# Patient Record
Sex: Female | Born: 1995 | Race: Black or African American | Hispanic: No | Marital: Single | Smoking: Never smoker
Health system: Southern US, Community
[De-identification: ages and names within clinical notes are randomized; demographics above are authoritative.]

## PROBLEM LIST (undated history)

## (undated) ENCOUNTER — Inpatient Hospital Stay (HOSPITAL_COMMUNITY): Payer: Self-pay

## (undated) ENCOUNTER — Emergency Department (HOSPITAL_COMMUNITY): Admission: EM | Discharge status: Absent without leave | Payer: Self-pay

## (undated) DIAGNOSIS — Z789 Other specified health status: Secondary | ICD-10-CM

## (undated) DIAGNOSIS — L309 Dermatitis, unspecified: Secondary | ICD-10-CM

## (undated) HISTORY — PX: NO PAST SURGERIES: SHX2092

---
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
## 2013-04-06 ENCOUNTER — Emergency Department (HOSPITAL_COMMUNITY)
Admission: EM | Admit: 2013-04-06 | Discharge: 2013-04-06 | Disposition: A | Discharge status: Home / Self Care | Payer: Managed Care, Other (non HMO) | Source: Home / Self Care

## 2013-04-06 ENCOUNTER — Encounter (HOSPITAL_COMMUNITY): Payer: Self-pay | Admitting: Emergency Medicine

## 2013-04-06 DIAGNOSIS — J020 Streptococcal pharyngitis: Secondary | ICD-10-CM

## 2013-04-06 MED ORDER — AMOXICILLIN 400 MG/5ML PO SUSR: ORAL | Status: DC

## 2013-04-06 NOTE — ED Provider Notes (Signed)
History     CSN: [id number]  Arrival date & time 04/06/13  1528   None     Chief Complaint  Patient presents with  . Generalized Body Aches    (Consider location/radiation/quality/duration/timing/severity/associated sxs/prior treatment) HPI Comments: 17 year old female comes in with onset of sore throat for 24 hours ago associated with headache, myalgias and low-grade fever. Denies nausea, vomiting or abdominal pain. Denies earache, shortness of breath or chest pain.   History reviewed. No pertinent past medical history.  History reviewed. No pertinent past surgical history.  No family history on file.  History  Substance Use Topics  . Smoking status: Never Smoker   . Smokeless tobacco: Not on file  . Alcohol Use: No    OB History   Grav Para Term Preterm Abortions TAB SAB Ect Mult Living                  Review of Systems  Constitutional: Positive for activity change. Negative for fever.  HENT: Positive for sore throat. Negative for congestion, rhinorrhea, neck pain, neck stiffness, postnasal drip, sinus pressure and ear discharge.   Respiratory: Negative.   Cardiovascular: Negative.   Gastrointestinal: Negative.   Musculoskeletal: Negative.   Skin: Negative.   Hematological: Positive for adenopathy.    Allergies  Review of patient's allergies indicates no known allergies.  Home Medications   Current Outpatient Rx  Name  Route  Sig  Dispense  Refill  . ibuprofen (ADVIL,MOTRIN) 200 MG tablet   Oral   Take 200 mg by mouth every 6 (six) hours as needed for pain.         Marland Kitchen amoxicillin (AMOXIL) 400 MG/5ML suspension      7.5 ml  bid x10 days   150 mL   0     BP 122/85  Pulse 81  Temp(Src) 99.2 F (37.3 C) (Oral)  Resp 20  SpO2 96%  Physical Exam  Nursing note and vitals reviewed. Constitutional: She is oriented to person, place, and time. She appears well-developed and well-nourished. No distress.  HENT:  Head: Normocephalic.  Right Ear:  External ear normal.  Left Ear: External ear normal.  Oropharynx with erythema, mildly enlarged tonsils with gray exudates.  Eyes: Conjunctivae and EOM are normal.  Neck: Normal range of motion. Neck supple.  Positive for bilateral enlarged tender anterior cervical nodes.  Cardiovascular: Normal rate and regular rhythm.   Murmur heard. Pulmonary/Chest: Effort normal. No respiratory distress. She has no rales.  Abdominal: Soft. There is no tenderness.  Musculoskeletal: Normal range of motion.  Lymphadenopathy:    She has cervical adenopathy.  Neurological: She is alert and oriented to person, place, and time. No cranial nerve deficit. She exhibits normal muscle tone.  Skin: Skin is warm and dry.  Psychiatric: She has a normal mood and affect.    ED Course  Procedures (including critical care time)  Labs Reviewed - No data to display No results found.   1. Strep pharyngitis       MDM  Amoxicillin 400 mg 5 meals 7.5 meals twice a day for 10 days No school or work tomorrow Drink plenty of fluids stay well hydrated Ibuprofen 400 mg every 6 hours when necessary sore throat discomfort Cepacol lozenges needed for comfort.        Hayden Rasmussen, NP 04/06/13 1622

## 2013-04-06 NOTE — ED Provider Notes (Signed)
Medical screening examination/treatment/procedure(s) were performed by resident physician or non-physician practitioner and as supervising physician I was immediately available for consultation/collaboration.   Barkley Bruns MD.   Linna Hoff, MD 04/06/13 [phone]

## 2013-04-06 NOTE — ED Notes (Signed)
Patient reports fever, body pain, and throat swelling and pain.  Fever has not been taken, running hot and cold.  Mild headache currently

## 2014-06-05 ENCOUNTER — Encounter (HOSPITAL_COMMUNITY): Payer: Self-pay | Admitting: Emergency Medicine

## 2014-06-05 ENCOUNTER — Emergency Department (HOSPITAL_COMMUNITY)
Admission: EM | Admit: 2014-06-05 | Discharge: 2014-06-05 | Disposition: A | Discharge status: Home / Self Care | Payer: Managed Care, Other (non HMO) | Attending: Emergency Medicine | Admitting: Emergency Medicine

## 2014-06-05 DIAGNOSIS — M545 Low back pain, unspecified: Secondary | ICD-10-CM

## 2014-06-05 DIAGNOSIS — Y92410 Unspecified street and highway as the place of occurrence of the external cause: Secondary | ICD-10-CM | POA: Insufficient documentation

## 2014-06-05 DIAGNOSIS — Y9389 Activity, other specified: Secondary | ICD-10-CM | POA: Insufficient documentation

## 2014-06-05 DIAGNOSIS — Z791 Long term (current) use of non-steroidal anti-inflammatories (NSAID): Secondary | ICD-10-CM | POA: Insufficient documentation

## 2014-06-05 DIAGNOSIS — IMO0002 Reserved for concepts with insufficient information to code with codable children: Secondary | ICD-10-CM | POA: Insufficient documentation

## 2014-06-05 MED ORDER — IBUPROFEN 800 MG PO TABS: 800.0000 mg | ORAL_TABLET | Freq: Three times a day (TID) | ORAL | Status: DC | PRN

## 2014-06-05 MED ORDER — CYCLOBENZAPRINE HCL 10 MG PO TABS: 10.0000 mg | ORAL_TABLET | Freq: Three times a day (TID) | ORAL | Status: DC | PRN

## 2014-06-05 NOTE — ED Provider Notes (Signed)
Medical screening examination/treatment/procedure(s) were performed by non-physician practitioner and as supervising physician I was immediately available for consultation/collaboration.   EKG Interpretation None        Glynn OctaveStephen Laguana Desautel, MD 06/05/14 2258
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
## 2014-08-30 ENCOUNTER — Ambulatory Visit: Payer: Managed Care, Other (non HMO)

## 2015-01-02 ENCOUNTER — Emergency Department (HOSPITAL_COMMUNITY)
Admission: EM | Admit: 2015-01-02 | Discharge: 2015-01-02 | Disposition: A | Discharge status: Home / Self Care | Payer: Managed Care, Other (non HMO) | Attending: Emergency Medicine | Admitting: Emergency Medicine

## 2015-01-02 ENCOUNTER — Emergency Department (HOSPITAL_COMMUNITY): Payer: Managed Care, Other (non HMO)

## 2015-01-02 ENCOUNTER — Encounter (HOSPITAL_COMMUNITY): Payer: Self-pay | Admitting: Emergency Medicine

## 2015-01-02 DIAGNOSIS — Z3202 Encounter for pregnancy test, result negative: Secondary | ICD-10-CM | POA: Insufficient documentation

## 2015-01-02 DIAGNOSIS — R10A1 Flank pain, right side: Secondary | ICD-10-CM

## 2015-01-02 DIAGNOSIS — R319 Hematuria, unspecified: Secondary | ICD-10-CM

## 2015-01-02 DIAGNOSIS — Z79899 Other long term (current) drug therapy: Secondary | ICD-10-CM | POA: Insufficient documentation

## 2015-01-02 DIAGNOSIS — R109 Unspecified abdominal pain: Secondary | ICD-10-CM

## 2015-01-02 DIAGNOSIS — N390 Urinary tract infection, site not specified: Secondary | ICD-10-CM

## 2015-01-02 LAB — LIPASE, BLOOD: LIPASE: 28 U/L (ref 11–59)

## 2015-01-02 LAB — COMPREHENSIVE METABOLIC PANEL
ALK PHOS: 57 U/L (ref 39–117)
ALT: 11 U/L (ref 0–35)
AST: 22 U/L (ref 0–37)
Albumin: 3.7 g/dL (ref 3.5–5.2)
Anion gap: 3 — ABNORMAL LOW (ref 5–15)
BUN: 8 mg/dL (ref 6–23)
CHLORIDE: 104 mmol/L (ref 96–112)
CO2: 30 mmol/L (ref 19–32)
CREATININE: 0.58 mg/dL (ref 0.50–1.10)
Calcium: 9.1 mg/dL (ref 8.4–10.5)
GFR calc Af Amer: >90 mL/min (ref >90)
GFR calc non Af Amer: >90 mL/min (ref >90)
Glucose, Bld: 97 mg/dL (ref 70–99)
POTASSIUM: 3.7 mmol/L (ref 3.5–5.1)
Sodium: 137 mmol/L (ref 135–145)
Total Bilirubin: 0.6 mg/dL (ref 0.3–1.2)
Total Protein: 7.4 g/dL (ref 6.0–8.3)

## 2015-01-02 LAB — URINALYSIS, ROUTINE W REFLEX MICROSCOPIC
Bilirubin Urine: NEGATIVE
Glucose, UA: NEGATIVE mg/dL
Ketones, ur: NEGATIVE mg/dL
NITRITE: POSITIVE — AB
Protein, ur: 100 mg/dL — AB
SPECIFIC GRAVITY, URINE: 1.019 (ref 1.005–1.030)
Urobilinogen, UA: 0.2 mg/dL (ref 0.0–1.0)
pH: 5 (ref 5.0–8.0)

## 2015-01-02 LAB — URINE MICROSCOPIC-ADD ON

## 2015-01-02 LAB — CBC WITH DIFFERENTIAL/PLATELET
Basophils Absolute: 0 10*3/uL (ref 0.0–0.1)
Basophils Relative: 1 % (ref 0–1)
Eosinophils Absolute: 0.1 10*3/uL (ref 0.0–0.7)
Eosinophils Relative: 2 % (ref 0–5)
HCT: 37.9 % (ref 36.0–46.0)
Hemoglobin: 12.8 g/dL (ref 12.0–15.0)
Lymphocytes Relative: 45 % (ref 12–46)
Lymphs Abs: 2 10*3/uL (ref 0.7–4.0)
MCH: 28.4 pg (ref 26.0–34.0)
MCHC: 33.8 g/dL (ref 30.0–36.0)
MCV: 84 fL (ref 78.0–100.0)
Monocytes Absolute: 0.4 10*3/uL (ref 0.1–1.0)
Monocytes Relative: 9 % (ref 3–12)
Neutro Abs: 1.9 10*3/uL (ref 1.7–7.7)
Neutrophils Relative %: 43 % (ref 43–77)
Platelets: 379 10*3/uL (ref 150–400)
RBC: 4.51 MIL/uL (ref 3.87–5.11)
RDW: 12.6 % (ref 11.5–15.5)
WBC: 4.5 10*3/uL (ref 4.0–10.5)

## 2015-01-02 LAB — PREGNANCY, URINE: PREG TEST UR: NEGATIVE

## 2015-01-02 MED ORDER — DEXTROSE 5 % IV SOLN
1.0000 g | Freq: Once | INTRAVENOUS | Status: AC
  Administered 2015-01-02: 1 g via INTRAVENOUS
  Filled 2015-01-02: qty 10

## 2015-01-02 MED ORDER — ACETAMINOPHEN-CODEINE 120-12 MG/5ML PO SOLN: 15.0000 mL | ORAL | Status: DC | PRN

## 2015-01-02 MED ORDER — HYDROMORPHONE HCL 1 MG/ML IJ SOLN
1.0000 mg | Freq: Once | INTRAMUSCULAR | Status: AC
  Administered 2015-01-02: 1 mg via INTRAVENOUS
  Filled 2015-01-02: qty 1

## 2015-01-02 MED ORDER — ONDANSETRON HCL 4 MG/2ML IJ SOLN
4.0000 mg | Freq: Once | INTRAMUSCULAR | Status: AC
  Administered 2015-01-02: 4 mg via INTRAVENOUS
  Filled 2015-01-02: qty 2

## 2015-01-02 MED ORDER — SODIUM CHLORIDE 0.9 % IV SOLN
1000.0000 mL | INTRAVENOUS | Status: DC
  Administered 2015-01-02: 1000 mL via INTRAVENOUS

## 2015-01-02 MED ORDER — SODIUM CHLORIDE 0.9 % IV SOLN
1000.0000 mL | Freq: Once | INTRAVENOUS | Status: AC
  Administered 2015-01-02: 1000 mL via INTRAVENOUS

## 2015-01-02 MED ORDER — CEPHALEXIN 250 MG/5ML PO SUSR: 500.0000 mg | Freq: Four times a day (QID) | ORAL | Status: AC

## 2015-01-02 NOTE — ED Provider Notes (Signed)
Patient seen and evaluated. Discussed with Dr. Preston FleetingGlick. CT pending at shift change. I discussed this with her. No signs of obstructive uropathy or hydronephrosis. Does show signs early nephrocalcinosis. I discussed this with patient. Robotic surgery in few. States her pain is well controlled. Plan is discharge home per Dr. Preston FleetingGlick with prescription for Keflex, and Phenergan codeine syrup.  Rolland PorterMark Kayan Blissett, MD 01/02/15 [phone]

## 2015-01-02 NOTE — Discharge Instructions (Signed)
Urinary Tract Infection Urinary tract infections (UTIs) can develop anywhere along your urinary tract. Your urinary tract is your body's drainage system for removing wastes and extra water. Your urinary tract includes two kidneys, two ureters, a bladder, and a urethra. Your kidneys are a pair of bean-shaped organs. Each kidney is about the size of your fist. They are located below your ribs, one on each side of your spine. CAUSES Infections are caused by microbes, which are microscopic organisms, including fungi, viruses, and bacteria. These organisms are so small that they can only be seen through a microscope. Bacteria are the microbes that most commonly cause UTIs. SYMPTOMS  Symptoms of UTIs may vary by age and gender of the patient and by the location of the infection. Symptoms in young women typically include a frequent and intense urge to urinate and a painful, burning feeling in the bladder or urethra during urination. Older women and men are more likely to be tired, shaky, and weak and have muscle aches and abdominal pain. A fever may mean the infection is in your kidneys. Other symptoms of a kidney infection include pain in your back or sides below the ribs, nausea, and vomiting. DIAGNOSIS To diagnose a UTI, your caregiver will ask you about your symptoms. Your caregiver also will ask to provide a urine sample. The urine sample will be tested for bacteria and white blood cells. White blood cells are made by your body to help fight infection. TREATMENT  Typically, UTIs can be treated with medication. Because most UTIs are caused by a bacterial infection, they usually can be treated with the use of antibiotics. The choice of antibiotic and length of treatment depend on your symptoms and the type of bacteria causing your infection. HOME CARE INSTRUCTIONS  If you were prescribed antibiotics, take them exactly as your caregiver instructs you. Finish the medication even if you feel better after you  have only taken some of the medication.  Drink enough water and fluids to keep your urine clear or pale yellow.  Avoid caffeine, tea, and carbonated beverages. They tend to irritate your bladder.  Empty your bladder often. Avoid holding urine for long periods of time.  Empty your bladder before and after sexual intercourse.  After a bowel movement, women should cleanse from front to back. Use each tissue only once. SEEK MEDICAL CARE IF:   You have back pain.  You develop a fever.  Your symptoms do not begin to resolve within 3 days. SEEK IMMEDIATE MEDICAL CARE IF:   You have severe back pain or lower abdominal pain.  You develop chills.  You have nausea or vomiting.  You have continued burning or discomfort with urination. MAKE SURE YOU:   Understand these instructions.  Will watch your condition.  Will get help right away if you are not doing well or get worse. Document Released: 08/22/2005 Document Revised: 05/13/2012 Document Reviewed: 12/21/2011 [facility] Patient Information 2015 [place], [place]. This information is not intended to replace advice given to you by your health care provider. Make sure you discuss any questions you have with your health care provider.  Cephalexin oral suspension What is this medicine? CEPHALEXIN (sef a LEX in) is a cephalosporin antibiotic. It is used to treat certain kinds of bacterial infections.It will not work for colds, flu, or other viral infections. This medicine may be used for other purposes; ask your health care provider or pharmacist if you have questions. COMMON BRAND NAME(S): Biocef, Keflex, Panixine What should I tell my  health care provider before I take this medicine? They need to know if you have any of these conditions: -kidney disease -stomach or intestine problems, especially colitis -an unusual or allergic reaction to cephalexin, other cephalosporins, penicillins, other antibiotics, medicines, foods, dyes or  preservatives -pregnant or trying to get pregnant -breast-feeding How should I use this medicine? Take this medicine by mouth. Follow the directions on your prescription label. Shake well before using. Use a specially marked spoon or container to measure your medicine. Ask your pharmacist if you do not have one. Household spoons are not accurate. You can take this medicine with food or on an empty stomach. If the medicine upsets your stomach, take it with food. Do not take your medicine more often than directed. Finish the full course prescribed by your doctor or health care professional even if you think your condition is better. Talk to your pediatrician regarding the use of this medicine in children. While this drug may be prescribed for selected conditions, precautions do apply. Overdosage: If you think you have taken too much of this medicine contact a poison control center or emergency room at once. NOTE: This medicine is only for you. Do not share this medicine with others. What if I miss a dose? If you miss a dose, take it as soon as you can. If it is almost time for your next dose, take only that dose. Do not take double or extra doses. There should be at least 4 to 6 hours between doses. What may interact with this medicine? -probenecid -some other antibiotics This list may not describe all possible interactions. Give your health care provider a list of all the medicines, herbs, non-prescription drugs, or dietary supplements you use. Also tell them if you smoke, drink alcohol, or use illegal drugs. Some items may interact with your medicine. What should I watch for while using this medicine? Tell your doctor or health care professional if your symptoms do not begin to improve in a few days. Do not treat diarrhea with over the counter products. Contact your doctor if you have diarrhea that lasts more than 2 days or if it is severe and watery. If you have diabetes, you may get a  false-positive result for sugar in your urine. Check with your doctor or health care professional. What side effects may I notice from receiving this medicine? Side effects that you should report to your doctor or health care professional as soon as possible: -allergic reactions like skin rash, itching or hives, swelling of the face, lips, or tongue -breathing problems -pain or difficulty passing urine -redness, blistering, peeling or loosening of the skin, including inside the mouth -severe or watery diarrhea -unusually weak or tired -yellowing of the eyes, skin Side effects that usually do not require medical attention (report to your doctor or health care professional if they continue or are bothersome): -gas or heartburn -genital or anal irritation -headache -joint or muscle pain -nausea, vomiting This list may not describe all possible side effects. Call your doctor for medical advice about side effects. You may report side effects to FDA at 1-800-FDA-1088. Where should I keep my medicine? Keep out of the reach of children. After this medicine is mixed by your pharmacist, store it in the refrigerator. Do not freeze. Throw away any unused medicine after 14 days. NOTE: This sheet is a summary. It may not cover all possible information. If you have questions about this medicine, talk to your doctor, pharmacist, or health care provider.  2015, Elsevier/Gold Standard. (2008-02-16 17:10:55)  Acetaminophen; Codeine oral solution What is this medicine? ACETAMINOPHEN; CODEINE (a set a MEE noe fen; KOE deen) is a pain reliever. It is used to treat mild to moderate pain. This medicine may be used for other purposes; ask your health care provider or pharmacist if you have questions. COMMON BRAND NAME(S): TY-PAP with Codeine, Tylenol with Codeine What should I tell my health care provider before I take this medicine? They need to know if you have any of these conditions: -brain tumor -Crohn's  disease, inflammatory bowel disease, or ulcerative colitis -drug abuse or addiction -head injury -heart or circulation problems -if you often drink alcohol -kidney disease or problems going to the bathroom -liver disease -lung disease, asthma, or breathing problems -an unusual or allergic reaction to acetaminophen, codeine, parabens, other medicines, foods, dyes, or preservatives -pregnant or trying to get pregnant -breast-feeding How should I use this medicine? Take this medicine by mouth. Use a specially marked spoon or dropper to measure your dose. Ask your pharmacist if you do not have a dropper or measuring spoon. Do not use a household spoon. Follow the directions on the prescription label. If the medicine upsets your stomach, take the medicine with food or milk. Do not take more than you are told to take. Talk to your pediatrician regarding the use of this medicine in children. Special care may be needed. Overdosage: If you think you have taken too much of this medicine contact a poison control center or emergency room at once. NOTE: This medicine is only for you. Do not share this medicine with others. What if I miss a dose? If you miss a dose, take it as soon as you can. If it is almost time for your next dose, take only that dose. Do not take double or extra doses. What may interact with this medicine? -alcohol -antihistamines -carbamazepine -isoniazid -medicines for depression, anxiety, or psychotic disturbances -medicines for sleep -muscle relaxants -naltrexone -narcotic medicines (opiates) for pain -phenobarbital, phenytoin, and fosphenytoin -tramadol This list may not describe all possible interactions. Give your health care provider a list of all the medicines, herbs, non-prescription drugs, or dietary supplements you use. Also tell them if you smoke, drink alcohol, or use illegal drugs. Some items may interact with your medicine. What should I watch for while using this  medicine? Tell your doctor or health care professional if your pain does not go away, if it gets worse, or if you have new or a different type of pain. You may develop tolerance to the medicine. Tolerance means that you will need a higher dose of the medicine for pain relief. Tolerance is normal and is expected if you take the medicine for a long time. Do not suddenly stop taking your medicine because you may develop a severe reaction. Your body becomes used to the medicine. This does NOT mean you are addicted. Addiction is a behavior related to getting and using a drug for a non-medical reason. If you have pain, you have a medical reason to take pain medicine. Your doctor will tell you how much medicine to take. If your doctor wants you to stop the medicine, the dose will be slowly lowered over time to avoid any side effects. You may get drowsy or dizzy when you first start taking the medicine or change doses. Do not drive, use machinery, or do anything that may be dangerous until you know how the medicine affects you. Stand or sit up slowly. There  are different types of narcotic medicines (opiates) for pain. If you take more than one type at the same time, you may have more side effects. Give your health care provider a list of all medicines you use. Your doctor will tell you how much medicine to take. Do not take more medicine than directed. Call emergency for help if you have problems breathing. The medicine will cause constipation. Try to have a bowel movement at least every 2 to 3 days. If you do not have a bowel movement for 3 days, call your doctor or health care professional. Too much acetaminophen can be very dangerous. Do not take Tylenol (acetaminophen) or medicines that contain acetaminophen with this medicine. Many non-prescription medicines contain acetaminophen. Always read the labels carefully. Immediately call your physician or get emergency help if you are breast-feeding and your baby is  sleepier than usual, is limp, or has difficulty breastfeeding or breathing. What side effects may I notice from receiving this medicine? Side effects that you should report to your doctor or health care professional as soon as possible: -allergic reactions like skin rash, itching or hives, swelling of the face, lips, or tongue -breathing problems -confusion -feeling faint or lightheaded, falls -stomach pain -unusual bleeding or bruising -unusually weak or tired -yellowing of the eyes, skin Side effects that usually do not require medical attention (report to your doctor or health care professional if they continue or are bothersome): -nausea, vomiting This list may not describe all possible side effects. Call your doctor for medical advice about side effects. You may report side effects to FDA at 1-800-FDA-1088. Where should I keep my medicine? Keep out of the reach of children. This medicine can be abused. Keep your medicine in a safe place to protect it from theft. Do not share this medicine with anyone. Selling or giving away this medicine is dangerous and against the law. Store at room temperature between 15 and 30 degrees C (59 and 86 degrees F). Protect from light. Keep container tightly closed. Throw away any unused medicine after the expiration date. Discard unused medicine and used packaging carefully. Pets and children can be harmed if they find used or lost packages. NOTE: This sheet is a summary. It may not cover all possible information. If you have questions about this medicine, talk to your doctor, pharmacist, or health care provider.  2015, Elsevier/Gold Standard. (2013-07-06 13:07:50)
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
## 2015-01-02 NOTE — ED Notes (Signed)
Pt reports R flank pain for the past 24 hours. Denies injury, dysuria, nvd, fevers, chills.

## 2015-01-02 NOTE — ED Notes (Signed)
Pt reports right flank pain since yesterday with no urinary symptoms.  Pt describes pain as sharp and appears to have pain while ambulating.  Pt denies any n/v/d.  Pt alert and oriented.

## 2015-01-04 LAB — URINE CULTURE: Colony Count: >=100000

## 2015-01-05 ENCOUNTER — Telehealth (HOSPITAL_BASED_OUTPATIENT_CLINIC_OR_DEPARTMENT_OTHER): Payer: Self-pay | Admitting: Emergency Medicine

## 2015-01-05 NOTE — Telephone Encounter (Signed)
Post ED Visit - Positive Culture Follow-up  Culture report reviewed by antimicrobial stewardship pharmacist: []  Wes Dulaney, Pharm.D., BCPS [x]  Celedonio MiyamotoJeremy Frens, Pharm.D., BCPS []  Georgina PillionElizabeth Martin, Pharm.D., BCPS []  ToluMinh Pham, [address]Ph[address] BCPS, AAHIVP []  Estella HuskMichelle Turner, Pharm.D., BCPS, AAHIVP []  Elder CyphersLorie Poole, [address]Ph[address]D., BCPS  Positive urine culture E. Coli Treated with cephalexin, organism sensitive to the same and no further patient follow-up is required at this time.  Jaclyn MullMiller, Jaclyn Delgado 01/05/2015, 4:58 PM

## 2015-04-28 ENCOUNTER — Emergency Department (HOSPITAL_COMMUNITY)
Admission: EM | Admit: 2015-04-28 | Discharge: 2015-04-28 | Disposition: A | Discharge status: Home / Self Care | Payer: 59 | Attending: Emergency Medicine | Admitting: Emergency Medicine

## 2015-04-28 ENCOUNTER — Encounter (HOSPITAL_COMMUNITY): Payer: Self-pay

## 2015-04-28 DIAGNOSIS — M25531 Pain in right wrist: Secondary | ICD-10-CM | POA: Insufficient documentation

## 2015-04-28 MED ORDER — IBUPROFEN 800 MG PO TABS: 800.0000 mg | ORAL_TABLET | Freq: Three times a day (TID) | ORAL | Status: DC | PRN

## 2015-04-28 NOTE — ED Notes (Signed)
Pt states she woke up 4 days ago with right wrist pain and is not sure what caused it. Denies injury. No deformity noted.

## 2015-04-28 NOTE — Discharge Instructions (Signed)
Wear a wrist splint to help decrease wrist pain.  Take ibuprofen as needed for pain.  Avoid repetitive movement.  Arthralgia Your caregiver has diagnosed you as suffering from an arthralgia. Arthralgia means there is pain in a joint. This can come from many reasons including:  Bruising the joint which causes soreness (inflammation) in the joint.  Wear and tear on the joints which occur as we grow older (osteoarthritis).  Overusing the joint.  Various forms of arthritis.  Infections of the joint. Regardless of the cause of pain in your joint, most of these different pains respond to anti-inflammatory drugs and rest. The exception to this is when a joint is infected, and these cases are treated with antibiotics, if it is a bacterial infection. HOME CARE INSTRUCTIONS   Rest the injured area for as long as directed by your caregiver. Then slowly start using the joint as directed by your caregiver and as the pain allows. Crutches as directed may be useful if the ankles, knees or hips are involved. If the knee was splinted or casted, continue use and care as directed. If an stretchy or elastic wrapping bandage has been applied today, it should be removed and re-applied every 3 to 4 hours. It should not be applied tightly, but firmly enough to keep swelling down. Watch toes and feet for swelling, bluish discoloration, coldness, numbness or excessive pain. If any of these problems (symptoms) occur, remove the ace bandage and re-apply more loosely. If these symptoms persist, contact your caregiver or return to this location.  For the first 24 hours, keep the injured extremity elevated on pillows while lying down.  Apply ice for 15-20 minutes to the sore joint every couple hours while awake for the first half day. Then 03-04 times per day for the first 48 hours. Put the ice in a plastic bag and place a towel between the bag of ice and your skin.  Wear any splinting, casting, elastic bandage  applications, or slings as instructed.  Only take over-the-counter or prescription medicines for pain, discomfort, or fever as directed by your caregiver. Do not use aspirin immediately after the injury unless instructed by your physician. Aspirin can cause increased bleeding and bruising of the tissues.  If you were given crutches, continue to use them as instructed and do not resume weight bearing on the sore joint until instructed. Persistent pain and inability to use the sore joint as directed for more than 2 to 3 days are warning signs indicating that you should see a caregiver for a follow-up visit as soon as possible. Initially, a hairline fracture (break in bone) may not be evident on X-rays. Persistent pain and swelling indicate that further evaluation, non-weight bearing or use of the joint (use of crutches or slings as instructed), or further X-rays are indicated. X-rays may sometimes not show a small fracture until a week or 10 days later. Make a follow-up appointment with your own caregiver or one to whom we have referred you. A radiologist (specialist in reading X-rays) may read your X-rays. Make sure you know how you are to obtain your X-ray results. Do not assume everything is normal if you do not hear from [place]. SEEK MEDICAL CARE IF: Bruising, swelling, or pain increases. SEEK IMMEDIATE MEDICAL CARE IF:   Your fingers or toes are numb or blue.  The pain is not responding to medications and continues to stay the same or get worse.  The pain in your joint becomes severe.  You develop  a fever over 102 F (38.9 C).  It becomes impossible to move or use the joint. MAKE SURE YOU:   Understand these instructions.  Will watch your condition.  Will get help right away if you are not doing well or get worse. Document Released: 11/12/2005 Document Revised: 02/04/2012 Document Reviewed: 06/30/2008 [facility] Patient Information 2015 [place], [place]. This information is not intended to  replace advice given to you by your health care provider. Make sure you discuss any questions you have with your health care provider.  Carpal Tunnel Syndrome Carpal tunnel syndrome is a disorder of the nervous system in the wrist that causes pain, hand weakness, and/or loss of feeling. Carpal tunnel syndrome is caused by the compression, stretching, or irritation of the median nerve at the wrist joint. Athletes who experience carpal tunnel syndrome may notice a decrease in their performance to the condition, especially for sports that require strong hand or wrist action.  SYMPTOMS   Tingling, numbness, or burning pain in the hand or fingers.  Inability to sleep due to pain in the hand.  Sharp pains that shoot from the wrist up the arm or to the fingers, especially at night.  Morning stiffness or cramping of the hand.  Thumb weakness, resulting in difficulty holding objects or making a fist.  Shiny, dry skin on the hand.  Reduced performance in any sport requiring a strong grip. CAUSES   Median nerve damage at the wrist is caused by pressure due to swelling, inflammation, or scarred tissue.  Sources of pressure include:  Repetitive gripping or squeezing that causes inflammation of the tendon sheaths.  Scarring or shortening of the ligament that covers the median nerve.  Traumatic injury to the wrist or forearm such as fracture, sprain, or dislocation.  Prolonged hyperextension (wrist bent backward) or hyperflexion (wrist bent downward) of the wrist. RISK INCREASES WITH:  Diabetes mellitus.  Menopause or amenorrhea.  Rheumatoid arthritis.  Raynaud disease.  Pregnancy.  Gout.  Kidney disease.  Ganglion cyst.  Repetitive hand or wrist action.  Hypothyroidism (underactive thyroid gland).  Repetitive jolting or shaking of the hands or wrist.  Prolonged forceful weight-bearing on the hands. PREVENTION  Bracing the hand and wrist straight during activities that  involve repetitive grasping.  For activities that require prolonged extension of the wrist (bending towards the top of the forearm) periodically change the position of your wrists.  Learn and use proper technique in activities that result in the wrist position in neutral to slight extension.  Avoid bending the wrist into full extension or flexion (up or down).  Keep the wrist in a straight (neutral) position. To keep the wrist in this position, wear a splint.  Avoid repetitive hand and wrist motions.  When possible avoid prolonged grasping of items (steering wheel of a car, a pen, a vacuum cleaner, or a rake).  Loosen your grip for activities that require prolonged grasping of items.  Place keyboards and writing surfaces at the correct height as to decrease strain on the wrist and hand.  Alternate work tasks to avoid prolonged wrist flexion.  Avoid pinching activities (needlework and writing) as they may irritate your carpal tunnel syndrome.  If these activities are necessary, complete them for shorter periods of time.  When writing, use a felt tip or rollerball pen and/or build up the grip on a pen to decrease the forces required for writing. PROGNOSIS  Carpal tunnel syndrome is usually curable with appropriate conservative treatment and sometimes resolves spontaneously. For some  cases, surgery is necessary, especially if muscle wasting or nerve changes have developed.  RELATED COMPLICATIONS   Permanent numbness and a weak thumb or fingers in the affected hand.  Permanent paralysis of a portion of the hand and fingers. TREATMENT  Treatment initially consists of stopping activities that aggravate the symptoms as well as medication and ice to reduce inflammation. A wrist splint is often recommended for wear during activities of repetitive motion as well as at night. It is also important to learn and use proper technique when performing activities that typically cause pain. On  occasion, a corticosteroid injection may be given. If symptoms persist despite conservative treatment, surgery may be an option. Surgical techniques free the pinched or compressed nerve. Carpal tunnel surgery is usually performed on an outpatient basis, meaning you go home the same day as surgery. These procedures provide almost complete relief of all symptoms in 95% of patients. Expect at least 2 weeks for healing after surgery. For cases that are the result of repeated jolting or shaking of the hand or wrist or prolonged hyperextension, surgery is not usually recommended because stretching of the median nerve, not compression, is usually the cause of carpal tunnel syndrome in these cases. MEDICATION   If pain medication is necessary, nonsteroidal anti-inflammatory medications, such as aspirin and ibuprofen, or other minor pain relievers, such as acetaminophen, are often recommended.  Do not take pain medication for 7 days before surgery.  Prescription pain relievers are usually only prescribed after surgery. Use only as directed and only as much as you need.  Corticosteroid injections may be given to reduce inflammation. However, they are not always recommended.  Vitamin B6 (pyridoxine) may reduce symptoms; use only if prescribed for your disorder. SEEK MEDICAL CARE IF:   Symptoms get worse or do not improve in 2 weeks despite treatment.  You also have a current or recent history of neck or shoulder injury that has resulted in pain or tingling elsewhere in your arm. Document Released: 11/12/2005 Document Revised: 03/29/2014 Document Reviewed: 02/24/2009 [facility] Patient Information 2015 [place], [place]. This information is not intended to replace advice given to you by your health care provider. Make sure you discuss any questions you have with your health care provider.

## 2015-04-28 NOTE — ED Notes (Signed)
[place], PA at bedside 

## 2015-04-28 NOTE — ED Provider Notes (Signed)
CSN: [id number]     Arrival date & time 04/28/15  1611 History   This chart was scribed for non-physician practitioner Jaclyn Helper, PA-C working with Arby Barrette, MD by Lyndel Safe, ED Scribe. This patient was seen in room TR04C/TR04C and the patient's care was started at 4:58 PM.    Chief Complaint  Patient presents with  . Wrist Pain   HPI  HPI Comments: Jaclyn Delgado is a 19 y.o. female who presents to the Emergency Department complaining of constant, moderate, sharp right wrist pain onset 4 days ago. She notes that she is a Conservation officer, nature at [employer] and her work requires a lot of competitive movements. The pt is right handed. She has not tried any alleviating treatments. She denies numbness, tingling, radiation of the pain, loss of sensation, trouble in her elbow, CP, SOB, or dyspnea.   History reviewed. No pertinent past medical history. History reviewed. No pertinent past surgical history. Family History  Problem Relation Age of Onset  . Renal Disease Mother    History  Substance Use Topics  . Smoking status: Never Smoker   . Smokeless tobacco: Not on file  . Alcohol Use: No   OB History    No data available     Review of Systems  Constitutional: Negative for fever.  Respiratory: Negative for shortness of breath.   Cardiovascular: Negative for chest pain.  Musculoskeletal: Positive for arthralgias.  Neurological: Negative for weakness and numbness.      Allergies  Review of patient's allergies indicates no known allergies.  Home Medications   Prior to Admission medications   Medication Sig Start Date End Date Taking? Authorizing Provider  acetaminophen-codeine 120-12 MG/5ML solution Take 15 mLs by mouth every 4 (four) hours as needed for moderate pain. 01/02/15   Dione Booze, MD  cyclobenzaprine (FLEXERIL) 10 MG tablet Take 1 tablet (10 mg total) by mouth 3 (three) times daily as needed for muscle spasms. 06/05/14   Trixie Dredge, PA-C  ibuprofen (ADVIL,MOTRIN) 200 MG  tablet Take 200 mg by mouth every 6 (six) hours as needed for pain.    Historical Provider, MD  ibuprofen (ADVIL,MOTRIN) 800 MG tablet Take 1 tablet (800 mg total) by mouth every 8 (eight) hours as needed for mild pain or moderate pain. 06/05/14   Trixie Dredge, PA-C   BP 112/74 mmHg  Pulse 61  Temp(Src) 98.4 F (36.9 C) (Oral)  Resp 18  Ht  (1.626 m)  Wt 108 lb (48.988 kg)  BMI 18.53 kg/m2  SpO2 100%  LMP 04/12/2015 Physical Exam  Constitutional: She appears well-developed and well-nourished.  HENT:  Head: Normocephalic and atraumatic.  Eyes: Conjunctivae are normal. Right eye exhibits no discharge. Left eye exhibits no discharge.  Pulmonary/Chest: Effort normal. No respiratory distress.  Musculoskeletal:  Mild tenderness to dorsal right wrist with no gross deformity and no rash.  Radial pulse 2+.  Normal grip and strength.     Neurological: She is alert. Coordination normal.  Skin: Skin is warm and dry. No rash noted. She is not diaphoretic. No erythema.  Psychiatric: She has a normal mood and affect.  Nursing note and vitals reviewed.   ED Course  Procedures  DIAGNOSTIC STUDIES: Oxygen Saturation is 100% on RA, normal  by my interpretation.    COORDINATION OF CARE: 4:53 PM Discussed treatment plan which includes to order NSAIDs with pt. Discussed with pt that her wrist is not likely to be broken so an X-ray is not necessary. Advised pt to  soak wrist in warm water with Epson salt. Discussed that repetative movements can result in arthritis. This could also be early carpal tunnel syndrome.  Pt acknowledges and agrees to plan.   Labs Review Labs Reviewed - No data to display  Imaging Review No results found.   EKG Interpretation None      MDM   Final diagnoses:  Wrist joint pain, right    BP 112/74 mmHg  Pulse 61  Temp(Src) 98.4 F (36.9 C) (Oral)  Resp 18  Ht 5\' 4"  (1.626 m)  Wt 108 lb (48.988 kg)  BMI 18.53 kg/m2  SpO2 100%  LMP 04/12/2015   I  personally performed the services described in this documentation, which was scribed in my presence. The recorded information has been reviewed and is accurate.     Jaclyn HelperBowie Esten Dollar, PA-C 04/28/15 1709  Arby BarretteMarcy Pfeiffer, MD 04/28/15 2233

## 2015-07-22 ENCOUNTER — Emergency Department (INDEPENDENT_AMBULATORY_CARE_PROVIDER_SITE_OTHER)
Admission: EM | Admit: 2015-07-22 | Discharge: 2015-07-22 | Disposition: A | Discharge status: Home / Self Care | Payer: 59 | Source: Home / Self Care | Attending: Family Medicine | Admitting: Family Medicine

## 2015-07-22 ENCOUNTER — Encounter (HOSPITAL_COMMUNITY): Payer: Self-pay | Admitting: *Deleted

## 2015-07-22 DIAGNOSIS — S31831A Laceration without foreign body of anus, initial encounter: Secondary | ICD-10-CM

## 2015-07-22 MED ORDER — HYDROCORTISONE ACETATE 25 MG RE SUPP: 25.0000 mg | Freq: Two times a day (BID) | RECTAL | Status: DC

## 2015-07-22 NOTE — ED Provider Notes (Signed)
CSN: [id number]     Arrival date & time 07/22/15  1637 History   First MD Initiated Contact with Patient 07/22/15 1751     Chief Complaint  Patient presents with  . Rectal Bleeding   (Consider location/radiation/quality/duration/timing/severity/associated sxs/prior Treatment) Patient is a 19 y.o. female presenting with hematochezia. The history is provided by the patient.  Rectal Bleeding Quality:  Bright red Amount:  Scant Duration:  1 day (on tues had 1 episode, none since.) Timing:  Rare Progression:  Resolved Chronicity:  New Context: not rectal pain   Context comment:  Pt clueless about issue., amybe constipation.   History reviewed. No pertinent past medical history. History reviewed. No pertinent past surgical history. Family History  Problem Relation Age of Onset  . Renal Disease Mother    Social History  Substance Use Topics  . Smoking status: Never Smoker   . Smokeless tobacco: None  . Alcohol Use: No   OB History    No data available     Review of Systems  Constitutional: Negative.   Gastrointestinal: Positive for constipation, hematochezia and anal bleeding. Negative for diarrhea, blood in stool and rectal pain.  Genitourinary: Negative.     Allergies  Review of patient's allergies indicates no known allergies.  Home Medications   Prior to Admission medications   Medication Sig Start Date End Date Taking? Authorizing Provider  acetaminophen-codeine 120-12 MG/5ML solution Take 15 mLs by mouth every 4 (four) hours as needed for moderate pain. 01/02/15   Dione Booze, MD  cyclobenzaprine (FLEXERIL) 10 MG tablet Take 1 tablet (10 mg total) by mouth 3 (three) times daily as needed for muscle spasms. 06/05/14   Trixie Dredge, PA-C  hydrocortisone (ANUSOL-HC) 25 MG suppository Place 1 suppository (25 mg total) rectally 2 (two) times daily. 07/22/15   Linna Hoff, MD  ibuprofen (ADVIL,MOTRIN) 800 MG tablet Take 1 tablet (800 mg total) by mouth every 8 (eight) hours  as needed for mild pain or moderate pain. 04/28/15   Fayrene Helper, PA-C   Meds Ordered and Administered this Visit  Medications - No data to display  BP 128/80 mmHg  Pulse 78  Temp(Src) 98.6 F (37 C) (Oral)  Resp 16  SpO2 100%  LMP 06/26/2015 No data found.   Physical Exam  Constitutional: She is oriented to person, place, and time. She appears well-developed and well-nourished.  Abdominal: Soft.  Since bleeding has resolved and no further issues, no digital exam today,   Neurological: She is alert and oriented to person, place, and time.  Skin: Skin is warm and dry.  Nursing note and vitals reviewed.   ED Course  Procedures (including critical care time)  Labs Review Labs Reviewed - No data to display  Imaging Review No results found.   Visual Acuity Review  Right Eye Distance:   Left Eye Distance:   Bilateral Distance:    Right Eye Near:   Left Eye Near:    Bilateral Near:         MDM   1. Tear of anal skin, initial encounter        Linna Hoff, MD 07/22/15 [phone]

## 2015-07-22 NOTE — ED Notes (Signed)
Pt  Reports      Symptoms  Of  Rectal  Bleeding     She  Noticed   When  She  Had a  bm      A  Few  Days  Ago    -  She  Also  Reports  Some  Pain in the  l  Side  Of  Her  Back       She  Reports  Some  Episodes  Of  Constipation  Which  She  Reports  Has  Had      intermittantly in the  Past   -  She  denys  Any urinary  Symptoms     She  Is  Sitting upright on the  Exam table  Speaking in  Complete  sentances

## 2015-09-17 ENCOUNTER — Encounter (HOSPITAL_COMMUNITY): Payer: Self-pay | Admitting: *Deleted

## 2015-09-17 ENCOUNTER — Emergency Department (INDEPENDENT_AMBULATORY_CARE_PROVIDER_SITE_OTHER)
Admission: EM | Admit: 2015-09-17 | Discharge: 2015-09-17 | Disposition: A | Discharge status: Home / Self Care | Payer: 59 | Source: Home / Self Care | Attending: Family Medicine | Admitting: Family Medicine

## 2015-09-17 DIAGNOSIS — J069 Acute upper respiratory infection, unspecified: Secondary | ICD-10-CM

## 2015-09-17 MED ORDER — IPRATROPIUM BROMIDE 0.06 % NA SOLN: 2.0000 | Freq: Four times a day (QID) | NASAL | Status: DC

## 2015-09-17 NOTE — ED Notes (Signed)
Pt  Reports      Cough      Congested        Scratchy  Throat          -   Pt    Reports     Symptoms  X  3  Days  Not  releived  By otc  meds                Sitting   Upright on the  Exam table  Speaking    In  Complete    sentances

## 2015-09-17 NOTE — Discharge Instructions (Signed)
Drink plenty of fluids as discussed, use medicine as prescribed, and mucinex or delsym for cough. Return or see your doctor if further problems °

## 2015-09-17 NOTE — ED Provider Notes (Signed)
CSN: [id number]     Arrival date & time 09/17/15  1847 History   First MD Initiated Contact with Patient 09/17/15 1909     Chief Complaint  Patient presents with  . URI   (Consider location/radiation/quality/duration/timing/severity/associated sxs/prior Treatment) Patient is a 19 y.o. female presenting with URI. The history is provided by the patient.  URI Presenting symptoms: congestion, cough, rhinorrhea and sore throat   Presenting symptoms: no fever   Severity:  Mild Onset quality:  Gradual Duration:  3 days Progression:  Unchanged Chronicity:  New Relieved by:  Nothing Ineffective treatments:  OTC medications Associated symptoms: no myalgias, no sneezing and no wheezing   Risk factors: sick contacts     History reviewed. No pertinent past medical history. History reviewed. No pertinent past surgical history. Family History  Problem Relation Age of Onset  . Renal Disease Mother    Social History  Substance Use Topics  . Smoking status: Never Smoker   . Smokeless tobacco: None  . Alcohol Use: No   OB History    No data available     Review of Systems  Constitutional: Negative.  Negative for fever.  HENT: Positive for congestion, postnasal drip, rhinorrhea and sore throat. Negative for sneezing.   Respiratory: Positive for cough. Negative for wheezing.   Cardiovascular: Negative.   Musculoskeletal: Negative for myalgias.  All other systems reviewed and are negative.   Allergies  Review of patient's allergies indicates no known allergies.  Home Medications   Prior to Admission medications   Medication Sig Start Date End Date Taking? Authorizing Provider  acetaminophen-codeine 120-12 MG/5ML solution Take 15 mLs by mouth every 4 (four) hours as needed for moderate pain. 01/02/15   Dione Boozeavid Glick, MD  cyclobenzaprine (FLEXERIL) 10 MG tablet Take 1 tablet (10 mg total) by mouth 3 (three) times daily as needed for muscle spasms. 06/05/14   Trixie DredgeEmily West, PA-C   hydrocortisone (ANUSOL-HC) 25 MG suppository Place 1 suppository (25 mg total) rectally 2 (two) times daily. 07/22/15   Linna HoffJames D Tomeika Weinmann, MD  ibuprofen (ADVIL,MOTRIN) 800 MG tablet Take 1 tablet (800 mg total) by mouth every 8 (eight) hours as needed for mild pain or moderate pain. 04/28/15   Fayrene HelperBowie Tran, PA-C  ipratropium (ATROVENT) 0.06 % nasal spray Place 2 sprays into both nostrils 4 (four) times daily. 09/17/15   Linna HoffJames D Daiquan Resnik, MD   Meds Ordered and Administered this Visit  Medications - No data to display  LMP 08/23/2015 No data found.   Physical Exam  Constitutional: She is oriented to person, place, and time. She appears well-developed and well-nourished. No distress.  HENT:  Mouth/Throat: Uvula is midline and mucous membranes are normal. Posterior oropharyngeal erythema present. No oropharyngeal exudate.  Neck: Normal range of motion. Neck supple.  Cardiovascular: Normal heart sounds and intact distal pulses.   Pulmonary/Chest: Effort normal and breath sounds normal.  Lymphadenopathy:    She has no cervical adenopathy.  Neurological: She is alert and oriented to person, place, and time.  Skin: Skin is warm and dry.  Nursing note and vitals reviewed.   ED Course  Procedures (including critical care time)  Labs Review Labs Reviewed - No data to display  Imaging Review No results found.   Visual Acuity Review  Right Eye Distance:   Left Eye Distance:   Bilateral Distance:    Right Eye Near:   Left Eye Near:    Bilateral Near:         MDM  1. URI (upper respiratory infection)        Linna Hoff, MD 09/19/15 1308

## 2015-10-19 ENCOUNTER — Emergency Department (INDEPENDENT_AMBULATORY_CARE_PROVIDER_SITE_OTHER)
Admission: EM | Admit: 2015-10-19 | Discharge: 2015-10-19 | Disposition: A | Discharge status: Home / Self Care | Payer: 59 | Source: Home / Self Care | Attending: Emergency Medicine | Admitting: Emergency Medicine

## 2015-10-19 ENCOUNTER — Encounter (HOSPITAL_COMMUNITY): Payer: Self-pay | Admitting: Emergency Medicine

## 2015-10-19 DIAGNOSIS — L309 Dermatitis, unspecified: Secondary | ICD-10-CM | POA: Diagnosis not present

## 2015-10-19 MED ORDER — TRIAMCINOLONE ACETONIDE 0.1 % EX CREA: 1.0000 "application " | TOPICAL_CREAM | Freq: Two times a day (BID) | CUTANEOUS | Status: DC

## 2015-10-19 NOTE — Discharge Instructions (Signed)
You have eczema. This will likely flare up more in the winter months. Make sure you are applying a thick layer of lotion every day. You can use Eucerin, Aveeno, cocoa butter. Use the triamcinolone cream twice a day as needed for flareups. Follow-up as needed.

## 2015-10-19 NOTE — ED Notes (Signed)
Eczema flare up

## 2015-10-19 NOTE — ED Provider Notes (Signed)
CSN: [id number]     Arrival date & time 10/19/15  1308 History   First MD Initiated Contact with Patient 10/19/15 1327     Chief Complaint  Patient presents with  . Eczema   (Consider location/radiation/quality/duration/timing/severity/associated sxs/prior Treatment) HPI  She is a 19 year old woman here for eczema. She states she has a history of eczema and it started to flareup a few days ago. She reports a patch on her left upper arm and on her right buttocks. It is itchy. She states she normally uses triamcinolone cream, but that her prescription ran out.  She is not currently using any lotion or emollients.  History reviewed. No pertinent past medical history. History reviewed. No pertinent past surgical history. Family History  Problem Relation Age of Onset  . Renal Disease Mother    Social History  Substance Use Topics  . Smoking status: Never Smoker   . Smokeless tobacco: None  . Alcohol Use: No   OB History    No data available     Review of Systems As in history of present illness Allergies  Review of patient's allergies indicates no known allergies.  Home Medications   Prior to Admission medications   Medication Sig Start Date End Date Taking? Authorizing Provider  acetaminophen-codeine 120-12 MG/5ML solution Take 15 mLs by mouth every 4 (four) hours as needed for moderate pain. 01/02/15   Dione Boozeavid Glick, MD  cyclobenzaprine (FLEXERIL) 10 MG tablet Take 1 tablet (10 mg total) by mouth 3 (three) times daily as needed for muscle spasms. 06/05/14   Trixie DredgeEmily West, PA-C  hydrocortisone (ANUSOL-HC) 25 MG suppository Place 1 suppository (25 mg total) rectally 2 (two) times daily. 07/22/15   Linna HoffJames D Kindl, MD  ibuprofen (ADVIL,MOTRIN) 800 MG tablet Take 1 tablet (800 mg total) by mouth every 8 (eight) hours as needed for mild pain or moderate pain. 04/28/15   Fayrene HelperBowie Tran, PA-C  ipratropium (ATROVENT) 0.06 % nasal spray Place 2 sprays into both nostrils 4 (four) times daily. 09/17/15    Linna HoffJames D Kindl, MD  triamcinolone cream (KENALOG) 0.1 % Apply 1 application topically 2 (two) times daily. 10/19/15   Charm RingsErin J Maurisio Ruddy, MD   Meds Ordered and Administered this Visit  Medications - No data to display  BP 121/77 mmHg  Pulse 55  Temp(Src) 97.5 F (36.4 C) (Oral)  Resp 16  SpO2 100%  LMP 10/15/2015 No data found.   Physical Exam  Constitutional: She is oriented to person, place, and time. She appears well-developed and well-nourished. No distress.  Cardiovascular: Normal rate.   Pulmonary/Chest: Effort normal.  Neurological: She is alert and oriented to person, place, and time.  Skin:  Eczematous rash to left upper inner arm. There are some excoriations.    ED Course  Procedures (including critical care time)  Labs Review Labs Reviewed - No data to display  Imaging Review No results found.   MDM   1. Eczema    Prescription given for triamcinolone cream. Recommended frequent use of lotion or emollients to prevent flareups.    Charm RingsErin J Mariel Gaudin, MD 10/19/15 1346

## 2016-04-08 ENCOUNTER — Encounter (HOSPITAL_COMMUNITY): Payer: Self-pay | Admitting: *Deleted

## 2016-04-08 ENCOUNTER — Emergency Department (HOSPITAL_COMMUNITY)
Admission: EM | Admit: 2016-04-08 | Discharge: 2016-04-08 | Disposition: A | Discharge status: Home / Self Care | Payer: 59 | Attending: Emergency Medicine | Admitting: Emergency Medicine

## 2016-04-08 DIAGNOSIS — R079 Chest pain, unspecified: Secondary | ICD-10-CM | POA: Diagnosis present

## 2016-04-08 DIAGNOSIS — R0789 Other chest pain: Secondary | ICD-10-CM | POA: Diagnosis not present

## 2016-04-08 NOTE — ED Notes (Signed)
Patient left at this time with all belongings. 

## 2016-04-08 NOTE — Discharge Instructions (Signed)

## 2016-04-08 NOTE — ED Notes (Signed)
There pt has been drinking wine and she has had chest paion for 10 minutes  lmp this month

## 2016-04-08 NOTE — ED Provider Notes (Signed)
CSN: [id number]     Arrival date & time 04/08/16  0138 History   First MD Initiated Contact with Patient 04/08/16 [phone]     Chief Complaint  Patient presents with  . Chest Pain     (Consider location/radiation/quality/duration/timing/severity/associated sxs/prior Treatment) Patient is a 20 y.o. female presenting with chest pain. The history is provided by the patient.  Chest Pain Pain location:  R chest (right shoulder) Pain quality: aching   Pain radiates to:  Does not radiate Pain severity:  Mild Onset quality:  Gradual Duration:  1 hour Timing:  Sporadic Progression:  Resolved Chronicity:  New Context comment:  Drinking wine Relieved by:  Nothing Worsened by:  Nothing tried Ineffective treatments:  None tried Associated symptoms: no abdominal pain, no cough, no fever, no lower extremity edema, no shortness of breath, no syncope and not vomiting     History reviewed. No pertinent past medical history. History reviewed. No pertinent past surgical history. No family history on file. Social History  Substance Use Topics  . Smoking status: Never Smoker   . Smokeless tobacco: None  . Alcohol Use: Yes   OB History    No data available     Review of Systems  Constitutional: Negative for fever.  Respiratory: Negative for cough and shortness of breath.   Cardiovascular: Positive for chest pain. Negative for syncope.  Gastrointestinal: Negative for vomiting and abdominal pain.  All other systems reviewed and are negative.     Allergies  Review of patient's allergies indicates no known allergies.  Home Medications   Prior to Admission medications   Not on File   BP 120/90 mmHg  Pulse 64  Temp(Src) 98 F (36.7 C)  Resp 16  Ht 5\' 4"  (1.626 m)  Wt 108 lb (48.988 kg)  BMI 18.53 kg/m2  SpO2 97%  LMP 04/01/2016 Physical Exam  Constitutional: She is oriented to person, place, and time. She appears well-developed and well-nourished. No distress.  HENT:  Head:  Normocephalic.  Eyes: Conjunctivae are normal.  Neck: Neck supple. No tracheal deviation present.  Cardiovascular: Normal rate, regular rhythm and normal heart sounds.   Pulmonary/Chest: Effort normal and breath sounds normal. No respiratory distress. She has no wheezes. She exhibits no tenderness.  Abdominal: Soft. She exhibits no distension. There is no tenderness.  Musculoskeletal:       Right shoulder: She exhibits normal range of motion, no tenderness, no swelling, no crepitus, no pain and normal strength.  Neurological: She is alert and oriented to person, place, and time.  Skin: Skin is warm and dry.  Psychiatric: She has a normal mood and affect.    ED Course  Procedures (including critical care time) Labs Review Labs Reviewed - No data to display  Imaging Review No results found. I have personally reviewed and evaluated these images and lab results as part of my medical decision-making.   EKG Interpretation   Date/Time:  Sunday Apr 08 2016 01:40:59 EDT Ventricular Rate:  57 PR Interval:  130 QRS Duration: 92 QT Interval:  404 QTC Calculation: 393 R Axis:   90 Text Interpretation:  Sinus bradycardia with sinus arrhythmia Rightward  axis Otherwise normal ECG No previous tracing Confirmed by Augustin Bun MD,  Joden Bonsall ([id number]) on 04/08/2016 4:59:31 AM      MDM   Final diagnoses:  Atypical chest pain   20 y.o. female presents with right shoulder pain and tightness in the center of her chest after drinking wine. Patient is sleeping comfortably when I  examined her and this has resolved. EKG interpreted by me without ST or T wave changes concerning for myocardial ischemia. No delta wave, no prolonged QTc, no brugada to suggest arrhythmogenicity.  Suspect noncardiac etiology currently. Plan to follow up with PCP as needed and return precautions discussed for worsening or new concerning symptoms.     Lyndal Pulley, MD 04/08/16 1000

## 2016-05-17 ENCOUNTER — Ambulatory Visit (INDEPENDENT_AMBULATORY_CARE_PROVIDER_SITE_OTHER): Payer: 59

## 2016-05-17 ENCOUNTER — Encounter: Payer: Self-pay | Admitting: Obstetrics and Gynecology

## 2016-05-17 DIAGNOSIS — Z3201 Encounter for pregnancy test, result positive: Secondary | ICD-10-CM | POA: Diagnosis not present

## 2016-05-17 LAB — POCT PREGNANCY, URINE: Preg Test, Ur: POSITIVE — AB

## 2016-05-17 NOTE — Progress Notes (Signed)
Pt presented today for a pregnancy test. Test confirms she is pregnant approximately six weeks five days. Pt plans to start care at our office. Pt advised to start taking prenatal vitamins.

## 2016-06-14 ENCOUNTER — Inpatient Hospital Stay (HOSPITAL_COMMUNITY)
Admission: AD | Admit: 2016-06-14 | Discharge: 2016-06-15 | Disposition: A | Discharge status: Home / Self Care | Payer: 59 | Source: Ambulatory Visit | Attending: Family Medicine | Admitting: Family Medicine

## 2016-06-14 ENCOUNTER — Inpatient Hospital Stay (HOSPITAL_COMMUNITY): Payer: 59

## 2016-06-14 DIAGNOSIS — O3481 Maternal care for other abnormalities of pelvic organs, first trimester: Secondary | ICD-10-CM | POA: Diagnosis not present

## 2016-06-14 DIAGNOSIS — R102 Pelvic and perineal pain: Secondary | ICD-10-CM | POA: Diagnosis present

## 2016-06-14 DIAGNOSIS — N949 Unspecified condition associated with female genital organs and menstrual cycle: Secondary | ICD-10-CM | POA: Diagnosis not present

## 2016-06-14 DIAGNOSIS — O9989 Other specified diseases and conditions complicating pregnancy, childbirth and the puerperium: Secondary | ICD-10-CM | POA: Diagnosis not present

## 2016-06-14 DIAGNOSIS — Z3A11 11 weeks gestation of pregnancy: Secondary | ICD-10-CM | POA: Insufficient documentation

## 2016-06-14 DIAGNOSIS — O26891 Other specified pregnancy related conditions, first trimester: Secondary | ICD-10-CM

## 2016-06-14 DIAGNOSIS — O208 Other hemorrhage in early pregnancy: Secondary | ICD-10-CM | POA: Insufficient documentation

## 2016-06-14 DIAGNOSIS — O26892 Other specified pregnancy related conditions, second trimester: Secondary | ICD-10-CM | POA: Diagnosis not present

## 2016-06-14 DIAGNOSIS — N8312 Corpus luteum cyst of left ovary: Secondary | ICD-10-CM | POA: Diagnosis not present

## 2016-06-14 LAB — CBC
HCT: 33.5 % — ABNORMAL LOW (ref 36.0–46.0)
Hemoglobin: 11.8 g/dL — ABNORMAL LOW (ref 12.0–15.0)
MCH: 28.9 pg (ref 26.0–34.0)
MCHC: 35.2 g/dL (ref 30.0–36.0)
MCV: 82.1 fL (ref 78.0–100.0)
PLATELETS: 283 10*3/uL (ref 150–400)
RBC: 4.08 MIL/uL (ref 3.87–5.11)
RDW: 12.9 % (ref 11.5–15.5)
WBC: 6 10*3/uL (ref 4.0–10.5)

## 2016-06-14 LAB — POCT PREGNANCY, URINE: PREG TEST UR: POSITIVE — AB

## 2016-06-14 LAB — URINALYSIS, ROUTINE W REFLEX MICROSCOPIC
BILIRUBIN URINE: NEGATIVE
Glucose, UA: NEGATIVE mg/dL
HGB URINE DIPSTICK: NEGATIVE
KETONES UR: NEGATIVE mg/dL
Leukocytes, UA: NEGATIVE
Nitrite: NEGATIVE
PROTEIN: NEGATIVE mg/dL
Specific Gravity, Urine: 1.025 (ref 1.005–1.030)
pH: 6 (ref 5.0–8.0)

## 2016-06-14 LAB — HCG, QUANTITATIVE, PREGNANCY: hCG, Beta Chain, Quant, S: 92192 m[IU]/mL — ABNORMAL HIGH (ref <5)

## 2016-06-14 LAB — OB RESULTS CONSOLE ABO/RH: RH TYPE: POSITIVE

## 2016-06-14 LAB — OB RESULTS CONSOLE RUBELLA ANTIBODY, IGM: RUBELLA: IMMUNE

## 2016-06-14 LAB — OB RESULTS CONSOLE ANTIBODY SCREEN: ANTIBODY SCREEN: NEGATIVE

## 2016-06-14 LAB — OB RESULTS CONSOLE HEPATITIS B SURFACE ANTIGEN: HEP B S AG: NEGATIVE

## 2016-06-14 NOTE — MAU Note (Signed)
PT  SAYS  SHE WENT  TO CLINIC  4  WEEKS  AGO -  HAD POSITIVE UPT-   BUT  NOTHING  IN COMPUTER.       STARTED   CRAMPING  AT 330PM.-   TOOK XS TYLENOL   2  TABS     AT 8PM-    NO RELIEF.    NO VAG  BLEEDING.  LAST SEX-    LAST WEEK .

## 2016-06-15 ENCOUNTER — Encounter (HOSPITAL_COMMUNITY): Payer: Self-pay | Admitting: *Deleted

## 2016-06-15 DIAGNOSIS — O9989 Other specified diseases and conditions complicating pregnancy, childbirth and the puerperium: Secondary | ICD-10-CM

## 2016-06-15 DIAGNOSIS — N949 Unspecified condition associated with female genital organs and menstrual cycle: Secondary | ICD-10-CM | POA: Diagnosis not present

## 2016-06-15 LAB — WET PREP, GENITAL
CLUE CELLS WET PREP: NONE SEEN
Sperm: NONE SEEN
Trich, Wet Prep: NONE SEEN
YEAST WET PREP: NONE SEEN

## 2016-06-15 LAB — GC/CHLAMYDIA PROBE AMP (~~LOC~~) NOT AT ARMC
Chlamydia: POSITIVE — AB
Neisseria Gonorrhea: NEGATIVE

## 2016-06-15 LAB — ABO/RH: ABO/RH(D): O POS

## 2016-06-15 NOTE — MAU Provider Note (Signed)
History     CSN: [id number]  Arrival date and time: 06/14/16 2027   None     Chief Complaint  Patient presents with  . Pelvic Pain   Pelvic Pain The patient's primary symptoms include pelvic pain. This is a new problem. The current episode started today. The problem occurs intermittently. The problem has been unchanged. Pain severity now: 8/10  The problem affects both sides. She is pregnant. There has been no bleeding. Nothing aggravates the symptoms. She has tried nothing for the symptoms. Menstrual history: LMP 03/31/16    No past medical history on file.  No past surgical history on file.  No family history on file.  Social History  Substance Use Topics  . Smoking status: Never Smoker   . Smokeless tobacco: Not on file  . Alcohol Use: Yes    Allergies: No Known Allergies  No prescriptions prior to admission    Review of Systems  Genitourinary: Positive for pelvic pain.   Physical Exam   Blood pressure 111/71, pulse 81, temperature 98.1 F (36.7 C), temperature source Oral, resp. rate 18, height 5\' 4"  (1.626 m), weight 111 lb (50.349 kg), last menstrual period 03/31/2016.  Physical Exam  Nursing note and vitals reviewed. Constitutional: She is oriented to person, place, and time. She appears well-developed and well-nourished. No distress.  HENT:  Head: Normocephalic.  Cardiovascular: Normal rate.   Respiratory: Effort normal.  GI: Soft. There is no tenderness. There is no rebound.  Neurological: She is alert and oriented to person, place, and time.  Skin: Skin is warm and dry.  Psychiatric: She has a normal mood and affect.   Results for orders placed or performed during the hospital encounter of 06/14/16 (from the past 24 hour(s))  Urinalysis, Routine w reflex microscopic ([facility])     Status: None   Collection Time: 06/14/16  9:27 PM  Result Value Ref Range   Color, Urine YELLOW YELLOW   APPearance CLEAR CLEAR   Specific Gravity, Urine 1.025 1.005 -  1.030   pH 6.0 5.0 - 8.0   Glucose, UA NEGATIVE NEGATIVE mg/dL   Hgb urine dipstick NEGATIVE NEGATIVE   Bilirubin Urine NEGATIVE NEGATIVE   Ketones, ur NEGATIVE NEGATIVE mg/dL   Protein, ur NEGATIVE NEGATIVE mg/dL   Nitrite NEGATIVE NEGATIVE   Leukocytes, UA NEGATIVE NEGATIVE  Pregnancy, urine POC     Status: Abnormal   Collection Time: 06/14/16  9:39 PM  Result Value Ref Range   Preg Test, Ur POSITIVE (A) NEGATIVE  hCG, quantitative, pregnancy     Status: Abnormal   Collection Time: 06/14/16 10:21 PM  Result Value Ref Range   hCG, Beta Chain, Quant, S 92192 (H) <5 mIU/mL  CBC     Status: Abnormal   Collection Time: 06/14/16 10:21 PM  Result Value Ref Range   WBC 6.0 4.0 - 10.5 K/uL   RBC 4.08 3.87 - 5.11 MIL/uL   Hemoglobin 11.8 (L) 12.0 - 15.0 g/dL   HCT 40.933.5 (L) 81.136.0 - 91.446.0 %   MCV 82.1 78.0 - 100.0 fL   MCH 28.9 26.0 - 34.0 pg   MCHC 35.2 30.0 - 36.0 g/dL   RDW 78.212.9 95.611.5 - 21.315.5 %   Platelets 283 150 - 400 K/uL  ABO/Rh     Status: None (Preliminary result)   Collection Time: 06/14/16 10:21 PM  Result Value Ref Range   ABO/RH(D) O POS   Wet prep, genital     Status: Abnormal   Collection  Time: 06/15/16 12:20 AM  Result Value Ref Range   Yeast Wet Prep HPF POC NONE SEEN NONE SEEN   Trich, Wet Prep NONE SEEN NONE SEEN   Clue Cells Wet Prep HPF POC NONE SEEN NONE SEEN   WBC, Wet Prep HPF POC MODERATE (A) NONE SEEN   Sperm NONE SEEN    US Ob Comp Less 14 Wks  06/14/2016  CLINICAL DATA:  Pain EXAM: OBSTETRIC <14 WK ULTRASOUND TECHNIQUE: Transabdominal ultrasound was performed for evaluation of the gestation as well as the maternal uterus and adnexal regions. COMPARISON:  None. FINDINGS: Intrauterine gestational sac: Present Yolk sac:  Not visualized Embryo:  Present Cardiac Activity: Present Heart Rate: 164 bpm CRL: 4.58 cm mm 11 w 3 d [place] EDC: December 31, 2016 Subchorionic hemorrhage: There is a 2.8 x 2.3 x 1.5 cm subchorionic hemorrhage. Maternal uterus/adnexae: Corpus luteum  cyst in the left ovary. The right ovary is normal. IMPRESSION: 1. Single live IUP with a gestational age calculated at 11 weeks and 3 days by crown-rump length. There is a subchorionic hemorrhage measuring up to 2.8 cm. Electronically Signed   By: Gerome Sam III M.D   On: 06/14/2016 23:52    MAU Course  Procedures  MDM   Assessment and Plan   1. Round ligament pain   2. Pelvic pain in pregnancy, antepartum, first trimester    DC home Comfort measures reviewed  2nd Trimester precautions  RX: none  Return to MAU as needed FU with OB as planned  Follow-up Information    Schedule an appointment as soon as possible for a visit with [facility].   Contact information:   [address] [place] [address] [phone]         Tawnya Crook 06/15/2016, 12:39 AM

## 2016-06-15 NOTE — Discharge Instructions (Signed)
Second Trimester of Pregnancy The second trimester is from week 13 through week 28, months 4 through 6. The second trimester is often a time when you feel your best. Your body has also adjusted to being pregnant, and you begin to feel better physically. Usually, morning sickness has lessened or quit completely, you may have more energy, and you may have an increase in appetite. The second trimester is also a time when the fetus is growing rapidly. At the end of the sixth month, the fetus is about 9 inches long and weighs about 1 pounds. You will likely begin to feel the baby move (quickening) between 18 and 20 weeks of the pregnancy. BODY CHANGES Your body goes through many changes during pregnancy. The changes vary from woman to woman.   Your weight will continue to increase. You will notice your lower abdomen bulging out.  You may begin to get stretch marks on your hips, abdomen, and breasts.  You may develop headaches that can be relieved by medicines approved by your health care provider.  You may urinate more often because the fetus is pressing on your bladder.  You may develop or continue to have heartburn as a result of your pregnancy.  You may develop constipation because certain hormones are causing the muscles that push waste through your intestines to slow down.  You may develop hemorrhoids or swollen, bulging veins (varicose veins).  You may have back pain because of the weight gain and pregnancy hormones relaxing your joints between the bones in your pelvis and as a result of a shift in weight and the muscles that support your balance.  Your breasts will continue to grow and be tender.  Your gums may bleed and may be sensitive to brushing and flossing.  Dark spots or blotches (chloasma, mask of pregnancy) may develop on your face. This will likely fade after the baby is born.  A dark line from your belly button to the pubic area (linea nigra) may appear. This will likely fade  after the baby is born.  You may have changes in your hair. These can include thickening of your hair, rapid growth, and changes in texture. Some women also have hair loss during or after pregnancy, or hair that feels dry or thin. Your hair will most likely return to normal after your baby is born. WHAT TO EXPECT AT YOUR PRENATAL VISITS During a routine prenatal visit:  You will be weighed to make sure you and the fetus are growing normally.  Your blood pressure will be taken.  Your abdomen will be measured to track your baby's growth.  The fetal heartbeat will be listened to.  Any test results from the previous visit will be discussed. Your health care provider may ask you:  How you are feeling.  If you are feeling the baby move.  If you have had any abnormal symptoms, such as leaking fluid, bleeding, severe headaches, or abdominal cramping.  If you are using any tobacco products, including cigarettes, chewing tobacco, and electronic cigarettes.  If you have any questions. Other tests that may be performed during your second trimester include:  Blood tests that check for:  Low iron levels (anemia).  Gestational diabetes (between 24 and 28 weeks).  Rh antibodies.  Urine tests to check for infections, diabetes, or protein in the urine.  An ultrasound to confirm the proper growth and development of the baby.  An amniocentesis to check for possible genetic problems.  Fetal screens for spina bifida  and Down syndrome.  HIV (human immunodeficiency virus) testing. Routine prenatal testing includes screening for HIV, unless you choose not to have this test. HOME CARE INSTRUCTIONS   Avoid all smoking, herbs, alcohol, and unprescribed drugs. These chemicals affect the formation and growth of the baby.  Do not use any tobacco products, including cigarettes, chewing tobacco, and electronic cigarettes. If you need help quitting, ask your health care provider. You may receive  counseling support and other resources to help you quit.  Follow your health care provider's instructions regarding medicine use. There are medicines that are either safe or unsafe to take during pregnancy.  Exercise only as directed by your health care provider. Experiencing uterine cramps is a good sign to stop exercising.  Continue to eat regular, healthy meals.  Wear a good support bra for breast tenderness.  Do not use hot tubs, steam rooms, or saunas.  Wear your seat belt at all times when driving.  Avoid raw meat, uncooked cheese, cat litter boxes, and soil used by cats. These carry germs that can cause birth defects in the baby.  Take your prenatal vitamins.  Take 1500-2000 mg of calcium daily starting at the 20th week of pregnancy until you deliver your baby.  Try taking a stool softener (if your health care provider approves) if you develop constipation. Eat more high-fiber foods, such as fresh vegetables or fruit and whole grains. Drink plenty of fluids to keep your urine clear or pale yellow.  Take warm sitz baths to soothe any pain or discomfort caused by hemorrhoids. Use hemorrhoid cream if your health care provider approves.  If you develop varicose veins, wear support hose. Elevate your feet for 15 minutes, 3-4 times a day. Limit salt in your diet.  Avoid heavy lifting, wear low heel shoes, and practice good posture.  Rest with your legs elevated if you have leg cramps or low back pain.  Visit your dentist if you have not gone yet during your pregnancy. Use a soft toothbrush to brush your teeth and be gentle when you floss.  A sexual relationship may be continued unless your health care provider directs you otherwise.  Continue to go to all your prenatal visits as directed by your health care provider. SEEK MEDICAL CARE IF:   You have dizziness.  You have mild pelvic cramps, pelvic pressure, or nagging pain in the abdominal area.  You have persistent nausea,  vomiting, or diarrhea.  You have a bad smelling vaginal discharge.  You have pain with urination. SEEK IMMEDIATE MEDICAL CARE IF:   You have a fever.  You are leaking fluid from your vagina.  You have spotting or bleeding from your vagina.  You have severe abdominal cramping or pain.  You have rapid weight gain or loss.  You have shortness of breath with chest pain.  You notice sudden or extreme swelling of your face, hands, ankles, feet, or legs.  You have not felt your baby move in over an hour.  You have severe headaches that do not go away with medicine.  You have vision changes.   This information is not intended to replace advice given to you by your health care provider. Make sure you discuss any questions you have with your health care provider.   Document Released: 11/06/2001 Document Revised: 12/03/2014 Document Reviewed: 01/13/2013 Elsevier Interactive Patient Education 2016 [employer]   Round Ligament Pain during Pregnancy Many women will experience a type of pain referred to as round ligament pain during  their pregnancy. This is associated with abdominal pain or discomfort. Since any type of abdominal pain during pregnancy can be disconcerting, it is important to talk about round ligament pain to relieve any anxiety or fears you may have regarding the symptoms you are feeling. Round ligament pain is due to normal changes that take place in the body during pregnancy. It is caused by stretching of the round ligaments attached to the uterus. More commonly it occurs on the right side of the pelvis. Round Ligament: An Overview Typically in the non-pregnant state the uterus is about the size of an apple or pear. There are thick ligaments which hold the uterus in place in the abdomen, referred to as round ligaments. During pregnancy, your uterus will expand in size and weight, and the ligaments supporting it will have to stretch, becoming longer and thinner.  As these ligaments pull and tug they may irritate nearby nerve fibers, which causes pain. The severity of the pain in some cases can seem extreme. Some common symptoms of round ligament pain include:  Ligament spasms or contractions/cramps that trigger a sharp pain typically on the right side of the abdomen.  Pain upon waking or suddenly rolling over in your sleep.  Pain in the abdomen that is sharp brought on by exercise or other vigorous activity. Similar Problems Round ligament pain is often mistaken for other medical conditions because the symptoms are similar. Acute abdominal pain during pregnancy may also be a sign of other conditions including:  Abdominal cramps - Some abdominal pain is simply caused by change in bowel habits associated with pregnancy. Gas is a common problem that can cause sharp, shooting pain. You should always seek out medical care if your pain is accompanied by fever, chills, pain upon urination or if you have difficulty walking. Further exams and tests will be conducted to ensure that you do not have a more serious condition. It is not uncommon for women with lower abdominal pain to have a urinary tract infection, thus you may also be asked for a urine sample. Treatment If all other conditions are ruled out you can treat your round ligament pain relatively easily. You may be advised to take some acetaminophen (Tylenol) to reduce the severity of any persistent pain and asked to reduce your activity level. You can apply a heating pad to the area of pain or take a warm bath. Lying on the opposite side of the pain may help as well. Most women will find relief from round ligament pain simply by altering their daily routines slightly. The good news is round ligament pain will disappear completely once you have given birth to your child!

## 2016-06-16 LAB — HIV ANTIBODY (ROUTINE TESTING W REFLEX): HIV Screen 4th Generation wRfx: NONREACTIVE

## 2016-06-16 LAB — RPR: RPR Ser Ql: NONREACTIVE

## 2016-06-18 ENCOUNTER — Telehealth (HOSPITAL_COMMUNITY): Payer: Self-pay | Admitting: *Deleted

## 2016-06-18 DIAGNOSIS — A749 Chlamydial infection, unspecified: Secondary | ICD-10-CM

## 2016-06-18 DIAGNOSIS — O98812 Other maternal infectious and parasitic diseases complicating pregnancy, second trimester: Principal | ICD-10-CM

## 2016-06-18 MED ORDER — AZITHROMYCIN 500 MG PO TABS: ORAL_TABLET | ORAL | 0 refills | Status: DC

## 2016-06-18 NOTE — Telephone Encounter (Signed)
Telephone call to patient regarding positive chlamydia culture, patient notified.  Rx routed to pharmacy per protocol.  Instructed patient to notify her partner for treatment and to abstain from sex for seven days post treatment.  Report faxed to health department.  

## 2016-07-03 ENCOUNTER — Encounter (HOSPITAL_COMMUNITY): Payer: Self-pay | Admitting: Emergency Medicine

## 2016-07-03 ENCOUNTER — Encounter (HOSPITAL_COMMUNITY): Payer: Self-pay

## 2016-08-08 ENCOUNTER — Ambulatory Visit (HOSPITAL_COMMUNITY)
Admission: EM | Admit: 2016-08-08 | Discharge: 2016-08-08 | Disposition: A | Discharge status: Home / Self Care | Payer: 59 | Attending: Family Medicine | Admitting: Family Medicine

## 2016-08-08 ENCOUNTER — Encounter (HOSPITAL_COMMUNITY): Payer: Self-pay | Admitting: Family Medicine

## 2016-08-08 DIAGNOSIS — Z79899 Other long term (current) drug therapy: Secondary | ICD-10-CM | POA: Diagnosis not present

## 2016-08-08 DIAGNOSIS — R109 Unspecified abdominal pain: Secondary | ICD-10-CM | POA: Diagnosis not present

## 2016-08-08 DIAGNOSIS — M549 Dorsalgia, unspecified: Secondary | ICD-10-CM | POA: Insufficient documentation

## 2016-08-08 DIAGNOSIS — N390 Urinary tract infection, site not specified: Secondary | ICD-10-CM | POA: Diagnosis not present

## 2016-08-08 LAB — POCT URINALYSIS DIP (DEVICE)
BILIRUBIN URINE: NEGATIVE
Glucose, UA: NEGATIVE mg/dL
HGB URINE DIPSTICK: NEGATIVE
Ketones, ur: NEGATIVE mg/dL
Nitrite: NEGATIVE
Protein, ur: NEGATIVE mg/dL
SPECIFIC GRAVITY, URINE: 1.02 (ref 1.005–1.030)
Urobilinogen, UA: 0.2 mg/dL (ref 0.0–1.0)
pH: 7.5 (ref 5.0–8.0)

## 2016-08-08 MED ORDER — CEPHALEXIN 500 MG PO CAPS: 500.0000 mg | ORAL_CAPSULE | Freq: Two times a day (BID) | ORAL | 0 refills | Status: AC

## 2016-08-08 NOTE — ED Triage Notes (Signed)
Pt here for back pain/around kidney area and urinary frequency. Pt [redacted] weeks pregnant.

## 2016-08-08 NOTE — ED Provider Notes (Signed)
CSN: [id number]     Arrival date & time 08/08/16  1037 History   First MD Initiated Contact with Patient 08/08/16 1148     Chief Complaint  Patient presents with  . Back Pain   (Consider location/radiation/quality/duration/timing/severity/associated sxs/prior Treatment) Mrs. Mainor is a well-appearing 20 y.o female, with no medical history, currently 18 weeks pregnancy confirmed by [place], presents today for possible UTI. She complaints of right flank pain x 3 days and pain is getting worst. Pain is sharp, intermittent, and "feels like somebody is squeezing my kidney". She also endorses urinary frequency. She denies dysuria or urgency. She have had this pain before and was diagnosed with UTI at that time. She denies vaginal discharge, vaginal bleeding, abdominal pain, nausea or vomiting.       History reviewed. No pertinent past medical history. History reviewed. No pertinent surgical history. Family History  Problem Relation Age of Onset  . Renal Disease Mother    Social History  Substance Use Topics  . Smoking status: Never Smoker  . Smokeless tobacco: Never Used  . Alcohol use Yes   OB History    Gravida Para Term Preterm AB Living   1 0 0 0 0     SAB TAB Ectopic Multiple Live Births   0 0 0         Review of Systems  Constitutional: Negative for chills and fatigue.  Respiratory: Negative for cough and shortness of breath.   Cardiovascular: Negative for palpitations and leg swelling.  Gastrointestinal: Positive for constipation. Negative for diarrhea, nausea and vomiting.  Genitourinary: Positive for flank pain and frequency. Negative for urgency, vaginal bleeding, vaginal discharge and vaginal pain.       +right flank pain   Neurological: Negative for dizziness.    Allergies  Review of patient's allergies indicates no known allergies.  Home Medications   Prior to Admission medications   Medication Sig Start Date End Date Taking? Authorizing Provider  cephALEXin  (KEFLEX) 500 MG capsule Take 1 capsule (500 mg total) by mouth 2 (two) times daily. 08/08/16 08/15/16  Lucia Estelle, NP   Meds Ordered and Administered this Visit  Medications - No data to display  BP 109/69   Pulse 89   Temp 97.5 F (36.4 C)   Resp 18   LMP 03/31/2016   SpO2 100%  No data found.   Physical Exam  Constitutional: She is oriented to person, place, and time. She appears well-developed.  HENT:  Head: Normocephalic and atraumatic.  Cardiovascular: Normal rate, regular rhythm and normal heart sounds.   Pulmonary/Chest: Effort normal and breath sounds normal.  Abdominal: Soft. Bowel sounds are normal. There is no tenderness.  Protuberant abdomen    Genitourinary:  Genitourinary Comments: Right CVA tenderness present  Musculoskeletal: Normal range of motion.  Neurological: She is alert and oriented to person, place, and time.  Skin: Skin is warm and dry.  Psychiatric: She has a normal mood and affect.  Nursing note and vitals reviewed.   Urgent Care Course   Clinical Course    Procedures (including critical care time)  Labs Review Labs Reviewed  POCT URINALYSIS DIP (DEVICE) - Abnormal; Notable for the following:       Result Value   Leukocytes, UA TRACE (*)    All other components within normal limits  URINE CULTURE    Imaging Review No results found.     MDM   1. UTI (lower urinary tract infection)    UA indicates UTI with  amount of Leukocytes. Urine culture ordered and pending. Will treat with Keflex 500 mg BID x 7 days. Follow up with OBGYN as scheduled or sooner if you do not improve after the antibiotic. Patient denies any questions. Discharge instruction given.    Lucia EstelleFeng Shamus Desantis, NP 08/08/16 1302

## 2016-08-09 LAB — URINE CULTURE

## 2016-08-15 ENCOUNTER — Telehealth (HOSPITAL_COMMUNITY): Payer: Self-pay | Admitting: Emergency Medicine

## 2016-08-15 NOTE — Telephone Encounter (Signed)
Called pt and notified of recent lab results from visit 9/13 Pt ID'd properly... Reports feeling better and sx have subsided Adv pt if sx are not getting better to return or to f/u w/PCP or OB/GYN.  Pt verb understanding.

## 2016-08-15 NOTE — Telephone Encounter (Signed)
-----   Message from Eustace MooreLaura W Murray, MD sent at 08/11/2016  3:46 PM EDT ----- Clinical staff, please let patient know that urine culture did not clearly demonstrate a UTI.   Recheck or followup OB/Gyn for further evaluation if urinary frequency/flank pain persist.  LM

## 2016-10-16 ENCOUNTER — Inpatient Hospital Stay (HOSPITAL_COMMUNITY)
Admission: AD | Admit: 2016-10-16 | Discharge: 2016-10-17 | Disposition: A | Discharge status: Home / Self Care | Payer: 59 | Source: Ambulatory Visit | Attending: Obstetrics & Gynecology | Admitting: Obstetrics & Gynecology

## 2016-10-16 DIAGNOSIS — Z79899 Other long term (current) drug therapy: Secondary | ICD-10-CM | POA: Insufficient documentation

## 2016-10-16 DIAGNOSIS — K5900 Constipation, unspecified: Secondary | ICD-10-CM | POA: Diagnosis not present

## 2016-10-16 DIAGNOSIS — O4693 Antepartum hemorrhage, unspecified, third trimester: Secondary | ICD-10-CM | POA: Diagnosis present

## 2016-10-16 DIAGNOSIS — Z3A28 28 weeks gestation of pregnancy: Secondary | ICD-10-CM

## 2016-10-16 DIAGNOSIS — O99613 Diseases of the digestive system complicating pregnancy, third trimester: Secondary | ICD-10-CM | POA: Insufficient documentation

## 2016-10-16 DIAGNOSIS — O26853 Spotting complicating pregnancy, third trimester: Secondary | ICD-10-CM | POA: Diagnosis not present

## 2016-10-16 HISTORY — DX: Other specified health status: Z78.9

## 2016-10-16 NOTE — MAU Note (Signed)
Pt reports she noticed blood on the tissue when she wiped earlier. Lower back pain today.

## 2016-10-16 NOTE — MAU Note (Signed)
Not in lobby

## 2016-10-17 ENCOUNTER — Encounter (HOSPITAL_COMMUNITY): Payer: Self-pay

## 2016-10-17 DIAGNOSIS — Z3A28 28 weeks gestation of pregnancy: Secondary | ICD-10-CM

## 2016-10-17 DIAGNOSIS — O4693 Antepartum hemorrhage, unspecified, third trimester: Secondary | ICD-10-CM | POA: Diagnosis not present

## 2016-10-17 DIAGNOSIS — O26853 Spotting complicating pregnancy, third trimester: Secondary | ICD-10-CM

## 2016-10-17 DIAGNOSIS — K5900 Constipation, unspecified: Secondary | ICD-10-CM | POA: Diagnosis not present

## 2016-10-17 LAB — WET PREP, GENITAL
CLUE CELLS WET PREP: NONE SEEN
Sperm: NONE SEEN
TRICH WET PREP: NONE SEEN
Yeast Wet Prep HPF POC: NONE SEEN

## 2016-10-17 LAB — OB RESULTS CONSOLE GC/CHLAMYDIA: GC PROBE AMP, GENITAL: NEGATIVE

## 2016-10-17 LAB — URINALYSIS, ROUTINE W REFLEX MICROSCOPIC
Bilirubin Urine: NEGATIVE
Glucose, UA: NEGATIVE mg/dL
Hgb urine dipstick: NEGATIVE
KETONES UR: NEGATIVE mg/dL
Leukocytes, UA: NEGATIVE
NITRITE: NEGATIVE
PH: 6 (ref 5.0–8.0)
Protein, ur: NEGATIVE mg/dL
SPECIFIC GRAVITY, URINE: 1.01 (ref 1.005–1.030)

## 2016-10-17 LAB — GC/CHLAMYDIA PROBE AMP (~~LOC~~) NOT AT ARMC
CHLAMYDIA, DNA PROBE: NEGATIVE
Neisseria Gonorrhea: NEGATIVE

## 2016-10-17 NOTE — MAU Provider Note (Signed)
History  Chief Complaint:  No chief complaint on file.  Jaclyn Delgado is a 20 y.o. 561P0000 female at 6566w4d presenting w/ report of small amt bright red bleeding on tissue after voiding earlier tonight, none since.  1st time she has seen any blood during pregnancy. No recent sexual intercourse. Does report constipation, bm ~ q 5days, not taking anything to help. Denies uti s/s, abnormal/malodorous vag d/c or vulvovaginal itching/irritation.  Did have CT earlier in pregnancy, was treated, states she's since had a neg POC.  Reports active fetal movement, contractions: none, vaginal bleeding: as noted above, membranes: intact.   [facility] (no prenatal records in media yet-has only had 2 visits w/ them, started off at [facility]).   Next visit today @ 1000 for visit and gtt.  Pregnancy complicated by nothing.   Obstetrical History: OB History    Gravida Para Term Preterm AB Living   1 0 0 0 0     SAB TAB Ectopic Multiple Live Births   0 0 0          Past Medical History: Past Medical History:  Diagnosis Date  . Medical history non-contributory     Past Surgical History: Past Surgical History:  Procedure Laterality Date  . NO PAST SURGERIES      Social History: Social History   Social History  . Marital status: Single    Spouse name: N/A  . Number of children: N/A  . Years of education: N/A   Social History Main Topics  . Smoking status: Never Smoker  . Smokeless tobacco: Never Used  . Alcohol use Yes  . Drug use: No  . Sexual activity: Not Asked   Other Topics Concern  . None   Social History Narrative   ** Merged History Encounter **        Allergies: No Known Allergies  Prescriptions Prior to Admission  Medication Sig Dispense Refill Last Dose  . Prenatal Vit-Fe Fumarate-FA (PRENATAL MULTIVITAMIN) TABS tablet Take 1 tablet by mouth daily at 12 noon.   10/17/2016 at Unknown time    Review of Systems  Pertinent pos/neg as  indicated in HPI  Physical Exam  Blood pressure 117/73, pulse 89, temperature 98.1 F (36.7 C), temperature source Oral, resp. rate 16, height 5\' 4"  (1.626 m), weight 53.5 kg (118 lb), last menstrual period 03/31/2016, SpO2 100 %. General appearance: alert, cooperative and no distress Lungs: clear to auscultation bilaterally, normal effort Heart: regular rate and rhythm Abdomen: gravid, soft, non-tender Extremities: No edema  Spec exam: cx visually long and closed, no blood visualized, mod amt white discharge w/o odor Cultures/Specimens: wet prep, gc/ct SVE: deferred  Fetal monitoring: FHR: 145 bpm, variability: moderate,  Accelerations: Present,  decelerations:  Absent Uterine activity: none  MAU Course  UA Spec exam w/ cultures  Labs:  Results for orders placed or performed during the hospital encounter of 10/16/16 (from the past 24 hour(s))  Urinalysis, Routine w reflex microscopic (not at [facility])     Status: None   Collection Time: 10/16/16 11:55 PM  Result Value Ref Range   Color, Urine YELLOW YELLOW   APPearance CLEAR CLEAR   Specific Gravity, Urine 1.010 1.005 - 1.030   pH 6.0 5.0 - 8.0   Glucose, UA NEGATIVE NEGATIVE mg/dL   Hgb urine dipstick NEGATIVE NEGATIVE   Bilirubin Urine NEGATIVE NEGATIVE   Ketones, ur NEGATIVE NEGATIVE mg/dL   Protein, ur NEGATIVE NEGATIVE mg/dL   Nitrite NEGATIVE NEGATIVE  Leukocytes, UA NEGATIVE NEGATIVE  Wet prep, genital     Status: Abnormal   Collection Time: 10/17/16 12:30 AM  Result Value Ref Range   Yeast Wet Prep HPF POC NONE SEEN NONE SEEN   Trich, Wet Prep NONE SEEN NONE SEEN   Clue Cells Wet Prep HPF POC NONE SEEN NONE SEEN   WBC, Wet Prep HPF POC MODERATE (A) NONE SEEN   Sperm NONE SEEN     Imaging:  n/a Assessment and Plan  A:  6048w4d SIUP  G1P0000  Spotting, resolved  Constipation  Cat 1 FHR  CT+earlier in pregnancy P:  D/C home  Reviewed ptl s/s, fkc  GC/CT pending  Discussed and gave printed info on  constipation prevention/relief  [facility] this morning as scheduled   Marge DuncansBooker, Kimberly Randall CNM,WHNP-BC 11/22/20171:22 AM

## 2016-10-17 NOTE — Discharge Instructions (Signed)
Call the health department or go to [facility] if:  You begin to have strong, frequent contractions  Your water breaks.  Sometimes it is a big gush of fluid, sometimes it is just a trickle that keeps getting your panties wet or running down your legs  You have vaginal bleeding.  It is normal to have a small amount of spotting if your cervix was checked.   You don't feel your baby moving like normal.  If you don't, get you something to eat and drink and lay down and focus on feeling your baby move.  You should feel at least 10 movements in 2 hours.  If you don't, you should call the health department or go to [facility].   Constipation  Drink plenty of fluid, preferably water, throughout the day  Eat foods high in fiber such as fruits, vegetables, and grains  Exercise, such as walking, is a good way to keep your bowels regular  Drink warm fluids, especially warm prune juice, or decaf coffee  Eat a 1/2 cup of real oatmeal (not instant), 1/2 cup applesauce, and 1/2-1 cup warm prune juice every day  If needed, you may take Colace (docusate sodium) stool softener once or twice a day to help keep the stool soft. If you are pregnant, wait until you are out of your first trimester (12-14 weeks of pregnancy)  If you still are having problems with constipation, you may take Miralax once daily as needed to help keep your bowels regular.  If you are pregnant, wait until you are out of your first trimester (12-14 weeks of pregnancy)   Vaginal Bleeding During Pregnancy, Third Trimester A small amount of bleeding (spotting) from the vagina is common in pregnancy. Sometimes the bleeding is normal and is not a problem, and sometimes it is a sign of something serious. Be sure to tell your doctor about any bleeding from your vagina right away. Follow these instructions at home:  Watch your condition for any changes.  Follow your doctor's instructions about how active you can be.  If you  are on bed rest:  You may need to stay in bed and only get up to use the bathroom.  You may be allowed to do some activities.  If you need help, make plans for someone to help you.  Write down:  The number of pads you use each day.  How often you change pads.  How soaked (saturated) your pads are.  Do not use tampons.  Do not douche.  Do not have sex or orgasms until your doctor says it is okay.  Follow your doctor's advice about lifting, driving, and doing physical activities.  If you pass any tissue from your vagina, save the tissue so you can show it to your doctor.  Only take medicines as told by your doctor.  Do not take aspirin because it can make you bleed.  Keep all follow-up visits as told by your doctor. Contact a doctor if:  You bleed from your vagina.  You have cramps.  You have labor pains.  You have a fever that does not go away after you take medicine. Get help right away if:  You have very bad cramps in your back or belly (abdomen).  You have chills.  You have a gush of fluid from your vagina.  You pass large clots or tissue from your vagina.  You bleed more.  You feel light-headed or weak.  You pass out (faint).  You  do not feel your baby move around as much as before. This information is not intended to replace advice given to you by your health care provider. Make sure you discuss any questions you have with your health care provider. Document Released: 03/29/2014 Document Revised: 04/19/2016 Document Reviewed: 07/20/2013 Elsevier Interactive Patient Education  2017 [employer]

## 2016-11-26 NOTE — L&D Delivery Note (Signed)
OB Delivery Summary  20 y.o G1P0000 at 3417w4d delivered a viable female infant in cephalic, LOA  position. There was no nuchal cord. The anterior shoulder delivered with ease. A 60 sec delayed cord clamping was performed. Cord clamped x2 and cut. Placenta delivered spontaneously intact, with 3VC. Fundus firm on exam with massage and pitocin. Good hemostasis noted.  Anesthesia: Epidural Laceration: superficial periurethral Suture: None Good hemostasis noted. EBL: 100cc  Mom and baby recovering in LDR.    Apgars: APGAR (1 MIN): 8   APGAR (5 MINS): 9   Weight: Pending skin to skin    Jaclyn HarmsMegan Campbell, MD PGY-2 12/26/2016, 10:09 PM   Patient is a G1 at 2217w4d who was admitted w/ SOL, uncomplicated prenatal course.  She progressed with augmentation via Pitocin & AROM.  I was gloved and present for delivery in its entirety.  Second stage of labor progressed to SVD.  No decels during second stage noted.  Complications: none  Lacerations: superficial periurethral abrasion- not repaired  EBL: 100cc  Jaclyn Delgado, CNM 12:14 AM  12/27/2016

## 2016-12-07 ENCOUNTER — Encounter (HOSPITAL_COMMUNITY): Payer: Self-pay

## 2016-12-07 ENCOUNTER — Inpatient Hospital Stay (HOSPITAL_COMMUNITY)
Admission: AD | Admit: 2016-12-07 | Discharge: 2016-12-07 | Disposition: A | Discharge status: Home / Self Care | Payer: 59 | Source: Ambulatory Visit | Attending: Obstetrics and Gynecology | Admitting: Obstetrics and Gynecology

## 2016-12-07 DIAGNOSIS — N930 Postcoital and contact bleeding: Secondary | ICD-10-CM | POA: Insufficient documentation

## 2016-12-07 DIAGNOSIS — O26893 Other specified pregnancy related conditions, third trimester: Secondary | ICD-10-CM | POA: Insufficient documentation

## 2016-12-07 DIAGNOSIS — Z3A35 35 weeks gestation of pregnancy: Secondary | ICD-10-CM | POA: Diagnosis not present

## 2016-12-07 NOTE — Discharge Instructions (Signed)

## 2016-12-07 NOTE — MAU Provider Note (Signed)
None     Chief Complaint:    Jaclyn MiniumLadajah R Delgado is  21 y.o. G1P0000 at 6428w6d presents complaining of bleeding after intercourse.  Saw spotting when wiping, none now. No contractions, pain, dishcharge  Obstetrical/Gynecological History: OB History    Gravida Para Term Preterm AB Living   1 0 0 0 0     SAB TAB Ectopic Multiple Live Births   0 0 0         Past Medical History: Past Medical History:  Diagnosis Date  . Medical history non-contributory     Past Surgical History: Past Surgical History:  Procedure Laterality Date  . NO PAST SURGERIES      Family History: Family History  Problem Relation Age of Onset  . Renal Disease Mother     Social History: Social History  Substance Use Topics  . Smoking status: Never Smoker  . Smokeless tobacco: Never Used  . Alcohol use Yes    Allergies: No Known Allergies  Meds:  Prescriptions Prior to Admission  Medication Sig Dispense Refill Last Dose  . Prenatal Vit-Fe Fumarate-FA (PRENATAL MULTIVITAMIN) TABS tablet Take 1 tablet by mouth daily at 12 noon.   10/17/2016 at Unknown time    Review of Systems   Constitutional: Negative for fever and chills Eyes: Negative for visual disturbances Respiratory: Negative for shortness of breath, dyspnea Cardiovascular: Negative for chest pain or palpitations  Gastrointestinal: Negative for vomiting, diarrhea and constipation Genitourinary: Negative for dysuria and urgency Musculoskeletal: Negative for back pain, joint pain, myalgias.  Normal ROM  Neurological: Negative for dizziness and headaches    Physical Exam  Blood pressure 113/77, pulse 98, resp. rate 17, height 5\' 4"  (1.626 m), weight 56.2 kg (124 lb), last menstrual period 03/31/2016, SpO2 100 %. GENERAL: Well-developed, well-nourished female in no acute distress.  LUNGS: Clear to auscultation bilaterally.  HEART: Regular rate and rhythm. ABDOMEN: Soft, nontender, nondistended, gravid.  EXTREMITIES: Nontender, no  edema, 2+ distal pulses. DTR's 2+ PELVIC: SSE:  Small amount of blood in vault.  Thin d/c c/w semen.  Wet prep sperm only.  Negative fern and valsalva and pooling CERVICAL EXAM: Dilatation 1cm   Effacement 0%   Station -3   Presentation: cephalic FHT:  Baseline rate 140 bpm   Variability moderate  Accelerations present   Decelerations none Contractions: Every 0 mins   Labs: No results found for this or any previous visit (from the past 24 hour(s)). Imaging Studies:  No results found.  Assessment: Jaclyn Delgado is  21 y.o. G1P0000 at 4828w6d presents with postcoital bleeding.  Plan: DC home  CRESENZO-DISHMAN,Myron Stankovich 1/12/20183:15 AM

## 2016-12-07 NOTE — MAU Note (Addendum)
Pt reports bleeding after intercourse.contractions.

## 2016-12-25 ENCOUNTER — Inpatient Hospital Stay (EMERGENCY_DEPARTMENT_HOSPITAL)
Admission: AD | Admit: 2016-12-25 | Discharge: 2016-12-25 | Disposition: A | Discharge status: Home / Self Care | Payer: 59 | Source: Ambulatory Visit | Attending: Family Medicine | Admitting: Family Medicine

## 2016-12-25 ENCOUNTER — Encounter (HOSPITAL_COMMUNITY): Payer: Self-pay | Admitting: *Deleted

## 2016-12-25 ENCOUNTER — Inpatient Hospital Stay (HOSPITAL_COMMUNITY): Payer: 59

## 2016-12-25 DIAGNOSIS — Z3A38 38 weeks gestation of pregnancy: Secondary | ICD-10-CM | POA: Insufficient documentation

## 2016-12-25 DIAGNOSIS — O471 False labor at or after 37 completed weeks of gestation: Secondary | ICD-10-CM

## 2016-12-25 DIAGNOSIS — O479 False labor, unspecified: Secondary | ICD-10-CM

## 2016-12-25 MED ORDER — LACTATED RINGERS IV BOLUS (SEPSIS)
1000.0000 mL | Freq: Once | INTRAVENOUS | Status: AC
  Administered 2016-12-25: 1000 mL via INTRAVENOUS

## 2016-12-25 NOTE — Discharge Instructions (Signed)
Third Trimester of Pregnancy The third trimester is from week 29 through week 40 (months 7 through 9). The third trimester is a time when the unborn baby (fetus) is growing rapidly. At the end of the ninth month, the fetus is about 20 inches in length and weighs 6-10 pounds. Body changes during your third trimester Your body goes through many changes during pregnancy. The changes vary from woman to woman. During the third trimester:  Your weight will continue to increase. You can expect to gain 25-35 pounds (11-16 kg) by the end of the pregnancy.  You may begin to get stretch marks on your hips, abdomen, and breasts.  You may urinate more often because the fetus is moving lower into your pelvis and pressing on your bladder.  You may develop or continue to have heartburn. This is caused by increased hormones that slow down muscles in the digestive tract.  You may develop or continue to have constipation because increased hormones slow digestion and cause the muscles that push waste through your intestines to relax.  You may develop hemorrhoids. These are swollen veins (varicose veins) in the rectum that can itch or be painful.  You may develop swollen, bulging veins (varicose veins) in your legs.  You may have increased body aches in the pelvis, back, or thighs. This is due to weight gain and increased hormones that are relaxing your joints.  You may have changes in your hair. These can include thickening of your hair, rapid growth, and changes in texture. Some women also have hair loss during or after pregnancy, or hair that feels dry or thin. Your hair will most likely return to normal after your baby is born.  Your breasts will continue to grow and they will continue to become tender. A yellow fluid (colostrum) may leak from your breasts. This is the first milk you are producing for your baby.  Your belly button may stick out.  You may notice more swelling in your hands, face, or  ankles.  You may have increased tingling or numbness in your hands, arms, and legs. The skin on your belly may also feel numb.  You may feel short of breath because of your expanding uterus.  You may have more problems sleeping. This can be caused by the size of your belly, increased need to urinate, and an increase in your body's metabolism.  You may notice the fetus "dropping," or moving lower in your abdomen.  You may have increased vaginal discharge.  Your cervix becomes thin and soft (effaced) near your due date. What to expect at prenatal visits You will have prenatal exams every 2 weeks until week 36. Then you will have weekly prenatal exams. During a routine prenatal visit:  You will be weighed to make sure you and the fetus are growing normally.  Your blood pressure will be taken.  Your abdomen will be measured to track your baby's growth.  The fetal heartbeat will be listened to.  Any test results from the previous visit will be discussed.  You may have a cervical check near your due date to see if you have effaced. At around 36 weeks, your health care provider will check your cervix. At the same time, your health care provider will also perform a test on the secretions of the vaginal tissue. This test is to determine if a type of bacteria, Group B streptococcus, is present. Your health care provider will explain this further. Your health care provider may ask you:    What your birth plan is.  How you are feeling.  If you are feeling the baby move.  If you have had any abnormal symptoms, such as leaking fluid, bleeding, severe headaches, or abdominal cramping.  If you are using any tobacco products, including cigarettes, chewing tobacco, and electronic cigarettes.  If you have any questions. Other tests or screenings that may be performed during your third trimester include:  Blood tests that check for low iron levels (anemia).  Fetal testing to check the health,  activity level, and growth of the fetus. Testing is done if you have certain medical conditions or if there are problems during the pregnancy.  Nonstress test (NST). This test checks the health of your baby to make sure there are no signs of problems, such as the baby not getting enough oxygen. During this test, a belt is placed around your belly. The baby is made to move, and its heart rate is monitored during movement. What is false labor? False labor is a condition in which you feel small, irregular tightenings of the muscles in the womb (contractions) that eventually go away. These are called Braxton Hicks contractions. Contractions may last for hours, days, or even weeks before true labor sets in. If contractions come at regular intervals, become more frequent, increase in intensity, or become painful, you should see your health care provider. What are the signs of labor?  Abdominal cramps.  Regular contractions that start at 10 minutes apart and become stronger and more frequent with time.  Contractions that start on the top of the uterus and spread down to the lower abdomen and back.  Increased pelvic pressure and dull back pain.  A watery or bloody mucus discharge that comes from the vagina.  Leaking of amniotic fluid. This is also known as your "water breaking." It could be a slow trickle or a gush. Let your doctor know if it has a color or strange odor. If you have any of these signs, call your health care provider right away, even if it is before your due date. Follow these instructions at home: Eating and drinking  Continue to eat regular, healthy meals.  Do not eat:  Raw meat or meat spreads.  Unpasteurized milk or cheese.  Unpasteurized juice.  Store-made salad.  Refrigerated smoked seafood.  Hot dogs or deli meat, unless they are piping hot.  More than 6 ounces of albacore tuna a week.  Shark, swordfish, king mackerel, or tile fish.  Store-made salads.  Raw  sprouts, such as mung bean or alfalfa sprouts.  Take prenatal vitamins as told by your health care provider.  Take 1000 mg of calcium daily as told by your health care provider.  If you develop constipation:  Take over-the-counter or prescription medicines.  Drink enough fluid to keep your urine clear or pale yellow.  Eat foods that are high in fiber, such as fresh fruits and vegetables, whole grains, and beans.  Limit foods that are high in fat and processed sugars, such as fried and sweet foods. Activity  Exercise only as directed by your health care provider. Healthy pregnant women should aim for 2 hours and 30 minutes of moderate exercise per week. If you experience any pain or discomfort while exercising, stop.  Avoid heavy lifting.  Do not exercise in extreme heat or humidity, or at high altitudes.  Wear low-heel, comfortable shoes.  Practice good posture.  Do not travel far distances unless it is absolutely necessary and only with the approval   of your health care provider.  Wear your seat belt at all times while in a car, on a bus, or on a plane.  Take frequent breaks and rest with your legs elevated if you have leg cramps or low back pain.  Do not use hot tubs, steam rooms, or saunas.  You may continue to have sex unless your health care provider tells you otherwise. Lifestyle  Do not use any products that contain nicotine or tobacco, such as cigarettes and e-cigarettes. If you need help quitting, ask your health care provider.  Do not drink alcohol.  Do not use any medicinal herbs or unprescribed drugs. These chemicals affect the formation and growth of the baby.  If you develop varicose veins:  Wear support pantyhose or compression stockings as told by your healthcare provider.  Elevate your feet for 15 minutes, 3-4 times a day.  Wear a supportive maternity bra to help with breast tenderness. General instructions  Take over-the-counter and prescription  medicines only as told by your health care provider. There are medicines that are either safe or unsafe to take during pregnancy.  Take warm sitz baths to soothe any pain or discomfort caused by hemorrhoids. Use hemorrhoid cream or witch hazel if your health care provider approves.  Avoid cat litter boxes and soil used by cats. These carry germs that can cause birth defects in the baby. If you have a cat, ask someone to clean the litter box for you.  To prepare for the arrival of your baby:  Take prenatal classes to understand, practice, and ask questions about the labor and delivery.  Make a trial run to the hospital.  Visit the hospital and tour the maternity area.  Arrange for maternity or paternity leave through employers.  Arrange for family and friends to take care of pets while you are in the hospital.  Purchase a rear-facing car seat and make sure you know how to install it in your car.  Pack your hospital bag.  Prepare the baby's nursery. Make sure to remove all pillows and stuffed animals from the baby's crib to prevent suffocation.  Visit your dentist if you have not gone during your pregnancy. Use a soft toothbrush to brush your teeth and be gentle when you floss.  Keep all prenatal follow-up visits as told by your health care provider. This is important. Contact a health care provider if:  You are unsure if you are in labor or if your water has broken.  You become dizzy.  You have mild pelvic cramps, pelvic pressure, or nagging pain in your abdominal area.  You have lower back pain.  You have persistent nausea, vomiting, or diarrhea.  You have an unusual or bad smelling vaginal discharge.  You have pain when you urinate. Get help right away if:  You have a fever.  You are leaking fluid from your vagina.  You have spotting or bleeding from your vagina.  You have severe abdominal pain or cramping.  You have rapid weight loss or weight gain.  You have  shortness of breath with chest pain.  You notice sudden or extreme swelling of your face, hands, ankles, feet, or legs.  Your baby makes fewer than 10 movements in 2 hours.  You have severe headaches that do not go away with medicine.  You have vision changes. Summary  The third trimester is from week 29 through week 40, months 7 through 9. The third trimester is a time when the unborn baby (fetus)   is growing rapidly.  During the third trimester, your discomfort may increase as you and your baby continue to gain weight. You may have abdominal, leg, and back pain, sleeping problems, and an increased need to urinate.  During the third trimester your breasts will keep growing and they will continue to become tender. A yellow fluid (colostrum) may leak from your breasts. This is the first milk you are producing for your baby.  False labor is a condition in which you feel small, irregular tightenings of the muscles in the womb (contractions) that eventually go away. These are called Braxton Hicks contractions. Contractions may last for hours, days, or even weeks before true labor sets in.  Signs of labor can include: abdominal cramps; regular contractions that start at 10 minutes apart and become stronger and more frequent with time; watery or bloody mucus discharge that comes from the vagina; increased pelvic pressure and dull back pain; and leaking of amniotic fluid. This information is not intended to replace advice given to you by your health care provider. Make sure you discuss any questions you have with your health care provider. Document Released: 11/06/2001 Document Revised: 04/19/2016 Document Reviewed: 01/13/2013 Elsevier Interactive Patient Education  2017 [employer]  

## 2016-12-25 NOTE — MAU Note (Signed)
PT  SAYS SHE STARTED  HURTING  BAD  WITH UC AT 10PM. PNC- HD .Marland Kitchen.Marland Kitchen. AT  35 WEEKS  - CLOSED.  Marland Kitchen.  DENIES HSV AND MRSA.  GBS-  POSITIVE

## 2016-12-25 NOTE — Progress Notes (Signed)
Reviewed BPP and AFI with MD and current FHR tracing. Will call back with plan of care

## 2016-12-25 NOTE — Progress Notes (Signed)
Notified of FHR tracing deceleration despite intervention and requesting MD to bedside.

## 2016-12-25 NOTE — MAU Provider Note (Signed)
Obstetric Resident MAU Note  Chief Complaint:  Contractions   None    HPI: Jaclyn Delgado is a 21 y.o. G1P0000 at [redacted]w[redacted]d who presents to maternity admissions reporting regular uterine contractions that have been present for the past couple of days. They have increased in intensity and frequency which prompted her to present to the MAU for a labor check. At that time, however she was placed on the monitor and found to have a fetal deceleration.   Reports irregular contractions. Denies leakage of fluid or vaginal bleeding. Good fetal movement.   Pregnancy Course: Receives care at [facility] There are no active problems to display for this patient.   Past Medical History:  Diagnosis Date  . Medical history non-contributory     OB History  Gravida Para Term Preterm AB Living  1 0 0 0 0    SAB TAB Ectopic Multiple Live Births  0 0 0        # Outcome Date GA Lbr Len/2nd Weight Sex Delivery Anes PTL Lv  1 Current               Past Surgical History:  Procedure Laterality Date  . NO PAST SURGERIES      Family History: Family History  Problem Relation Age of Onset  . Renal Disease Mother     Social History: Social History  Substance Use Topics  . Smoking status: Never Smoker  . Smokeless tobacco: Never Used  . Alcohol use Yes    Allergies: Not on File  Prescriptions Prior to Admission  Medication Sig Dispense Refill Last Dose  . Prenatal Vit-Fe Fumarate-FA (PRENATAL MULTIVITAMIN) TABS tablet Take 1 tablet by mouth daily at 12 noon.   1500    ROS: Pertinent findings in history of present illness.  Physical Exam  Blood pressure 117/71, pulse (!) 59, temperature 97.3 F (36.3 C), temperature source Oral, resp. rate 18, height 5\' 4"  (1.626 m), weight 125 lb 12 oz (57 kg), last menstrual period 03/31/2016. CONSTITUTIONAL: Well-developed, well-nourished female in no acute distress.  HENT:  Normocephalic, atraumatic, Moist mucus membranes EYES: Conjunctivae are normal.  No  scleral icterus.  NECK: Normal range of motion, supple SKIN: Skin is warm and dry. No rash noted. Not diaphoretic. No erythema. No pallor. NEUROLGIC: Alert and oriented to person, place, and time. No focal defects PSYCHIATRIC: Normal mood and affect. Normal behavior. Normal judgment and thought content. CARDIOVASCULAR: Normal heart rate noted, regular rhythm RESPIRATORY: No increased work of breathing. Stable on room air. LCTAB.  ABDOMEN: Soft, nontender, nondistended, gravid appropriate for gestational age MUSCULOSKELETAL: Normal range of motion. No edema and no tenderness. 2+ distal pulses.  Dilation: Closed Effacement (%): 50 Cervical Position: Posterior Station: -3 Presentation: Undeterminable Exam by:: Ma Hillock RNC  FHT:  Baseline rate in the 140s , moderate variability, accelerations present, a couple of variable decelerations noted.  Contractions: irregular pattern with uterine irritability noted   Labs: No results found for this or any previous visit (from the past 24 hour(s)).  Imaging:  No results found.  MAU Course: Patient presented initially for a labor check, but subsequently was found to have a variable deceleration. She was maintained on the monitor for observation and given a 1L LR bolus. She again had an additional deceleration. A BPP was performed and found to be 8/8.  She was monitored for an additional hour and had a reactive NST to make a 10/10.   Assessment: 1. Uterine contractions during pregnancy  Plan: Discharge home Labor precautions reviewed Follow up with OB provider  [facility].   Why:  At previously scheduled follow-up appointment Contact information: [address] [place] [id number] [phone]           Allergies as of 12/25/2016   Not on File     Medication List    TAKE these medications   prenatal multivitamin Tabs tablet Take 1 tablet by mouth daily at 12 noon.        Lise AuerMegan C Campbell, MD PGY-2 12/25/2016 6:26 AM  OB FELLOW DISCHARGE ATTESTATION  I have seen and examined this patient and agree with above documentation in the resident's note.   Ernestina PennaNicholas Schenk, MD 8:02 AM

## 2016-12-25 NOTE — Progress Notes (Signed)
Notified of FHR deceleration. Ordered BPP

## 2016-12-26 ENCOUNTER — Encounter (HOSPITAL_COMMUNITY): Payer: Self-pay | Admitting: *Deleted

## 2016-12-26 ENCOUNTER — Inpatient Hospital Stay (HOSPITAL_COMMUNITY): Payer: 59 | Admitting: Anesthesiology

## 2016-12-26 ENCOUNTER — Inpatient Hospital Stay (HOSPITAL_COMMUNITY)
Admission: AD | Admit: 2016-12-26 | Discharge: 2016-12-28 | DRG: 775 | Disposition: A | Discharge status: Home / Self Care | Payer: 59 | Source: Ambulatory Visit | Attending: Obstetrics and Gynecology | Admitting: Obstetrics and Gynecology

## 2016-12-26 DIAGNOSIS — O99824 Streptococcus B carrier state complicating childbirth: Principal | ICD-10-CM | POA: Diagnosis present

## 2016-12-26 DIAGNOSIS — Z3A38 38 weeks gestation of pregnancy: Secondary | ICD-10-CM

## 2016-12-26 DIAGNOSIS — Z3493 Encounter for supervision of normal pregnancy, unspecified, third trimester: Secondary | ICD-10-CM | POA: Diagnosis present

## 2016-12-26 LAB — CBC
HEMATOCRIT: 31.2 % — AB (ref 36.0–46.0)
HEMOGLOBIN: 10.6 g/dL — AB (ref 12.0–15.0)
MCH: 28.7 pg (ref 26.0–34.0)
MCHC: 34 g/dL (ref 30.0–36.0)
MCV: 84.6 fL (ref 78.0–100.0)
Platelets: 256 10*3/uL (ref 150–400)
RBC: 3.69 MIL/uL — AB (ref 3.87–5.11)
RDW: 14.1 % (ref 11.5–15.5)
WBC: 5.2 10*3/uL (ref 4.0–10.5)

## 2016-12-26 LAB — RPR: RPR: NONREACTIVE

## 2016-12-26 LAB — ABO/RH: ABO/RH(D): O POS

## 2016-12-26 LAB — TYPE AND SCREEN
ABO/RH(D): O POS
Antibody Screen: NEGATIVE

## 2016-12-26 MED ORDER — DIPHENHYDRAMINE HCL 50 MG/ML IJ SOLN: 12.5000 mg | INTRAMUSCULAR | Status: DC | PRN

## 2016-12-26 MED ORDER — IBUPROFEN 600 MG PO TABS
600.0000 mg | ORAL_TABLET | Freq: Four times a day (QID) | ORAL | Status: DC
  Administered 2016-12-26: 600 mg via ORAL
  Filled 2016-12-26 (×2): qty 1

## 2016-12-26 MED ORDER — ZOLPIDEM TARTRATE 5 MG PO TABS: 5.0000 mg | ORAL_TABLET | Freq: Every evening | ORAL | Status: DC | PRN

## 2016-12-26 MED ORDER — ONDANSETRON HCL 4 MG/2ML IJ SOLN: 4.0000 mg | Freq: Four times a day (QID) | INTRAMUSCULAR | Status: DC | PRN

## 2016-12-26 MED ORDER — SOD CITRATE-CITRIC ACID 500-334 MG/5ML PO SOLN: 30.0000 mL | ORAL | Status: DC | PRN

## 2016-12-26 MED ORDER — PHENYLEPHRINE 40 MCG/ML (10ML) SYRINGE FOR IV PUSH (FOR BLOOD PRESSURE SUPPORT)
80.0000 ug | PREFILLED_SYRINGE | INTRAVENOUS | Status: DC | PRN
  Filled 2016-12-26: qty 5

## 2016-12-26 MED ORDER — OXYTOCIN 40 UNITS IN LACTATED RINGERS INFUSION - SIMPLE MED
1.0000 m[IU]/min | INTRAVENOUS | Status: DC
  Administered 2016-12-26: 2 m[IU]/min via INTRAVENOUS

## 2016-12-26 MED ORDER — COCONUT OIL OIL
1.0000 "application " | TOPICAL_OIL | Status: DC | PRN
  Filled 2016-12-26: qty 120

## 2016-12-26 MED ORDER — OXYCODONE-ACETAMINOPHEN 5-325 MG PO TABS: 2.0000 | ORAL_TABLET | ORAL | Status: DC | PRN

## 2016-12-26 MED ORDER — LACTATED RINGERS IV SOLN
INTRAVENOUS | Status: DC
  Administered 2016-12-26: 06:00:00 via INTRAVENOUS

## 2016-12-26 MED ORDER — OXYTOCIN 40 UNITS IN LACTATED RINGERS INFUSION - SIMPLE MED
2.5000 [IU]/h | INTRAVENOUS | Status: DC
  Administered 2016-12-26: 2.5 [IU]/h via INTRAVENOUS
  Filled 2016-12-26: qty 1000

## 2016-12-26 MED ORDER — OXYCODONE-ACETAMINOPHEN 5-325 MG PO TABS: 1.0000 | ORAL_TABLET | ORAL | Status: DC | PRN

## 2016-12-26 MED ORDER — SIMETHICONE 80 MG PO CHEW: 80.0000 mg | CHEWABLE_TABLET | ORAL | Status: DC | PRN

## 2016-12-26 MED ORDER — ONDANSETRON HCL 4 MG/2ML IJ SOLN: 4.0000 mg | INTRAMUSCULAR | Status: DC | PRN

## 2016-12-26 MED ORDER — TERBUTALINE SULFATE 1 MG/ML IJ SOLN
0.2500 mg | Freq: Once | INTRAMUSCULAR | Status: DC | PRN
  Filled 2016-12-26: qty 1

## 2016-12-26 MED ORDER — ACETAMINOPHEN 325 MG PO TABS: 650.0000 mg | ORAL_TABLET | ORAL | Status: DC | PRN

## 2016-12-26 MED ORDER — EPHEDRINE 5 MG/ML INJ
10.0000 mg | INTRAVENOUS | Status: DC | PRN
  Filled 2016-12-26: qty 4

## 2016-12-26 MED ORDER — FENTANYL 2.5 MCG/ML BUPIVACAINE 1/10 % EPIDURAL INFUSION (WH - ANES)
14.0000 mL/h | INTRAMUSCULAR | Status: DC | PRN
  Administered 2016-12-26 (×2): 14 mL/h via EPIDURAL
  Filled 2016-12-26: qty 100

## 2016-12-26 MED ORDER — BENZOCAINE-MENTHOL 20-0.5 % EX AERO: 1.0000 "application " | INHALATION_SPRAY | CUTANEOUS | Status: DC | PRN

## 2016-12-26 MED ORDER — FLEET ENEMA 7-19 GM/118ML RE ENEM: 1.0000 | ENEMA | RECTAL | Status: DC | PRN

## 2016-12-26 MED ORDER — PRENATAL MULTIVITAMIN CH
1.0000 | ORAL_TABLET | Freq: Every day | ORAL | Status: DC
  Administered 2016-12-27: 1 via ORAL
  Filled 2016-12-26: qty 1

## 2016-12-26 MED ORDER — LACTATED RINGERS IV SOLN
500.0000 mL | INTRAVENOUS | Status: DC | PRN
  Administered 2016-12-26: 300 mL via INTRAVENOUS
  Administered 2016-12-26: 500 mL via INTRAVENOUS

## 2016-12-26 MED ORDER — DIPHENHYDRAMINE HCL 25 MG PO CAPS: 25.0000 mg | ORAL_CAPSULE | Freq: Four times a day (QID) | ORAL | Status: DC | PRN

## 2016-12-26 MED ORDER — OXYCODONE HCL 5 MG PO TABS
5.0000 mg | ORAL_TABLET | ORAL | Status: DC | PRN
  Administered 2016-12-27: 5 mg via ORAL
  Filled 2016-12-26: qty 1

## 2016-12-26 MED ORDER — LIDOCAINE HCL (PF) 1 % IJ SOLN
INTRAMUSCULAR | Status: DC | PRN
  Administered 2016-12-26: 6 mL via EPIDURAL
  Administered 2016-12-26: 4 mL

## 2016-12-26 MED ORDER — WITCH HAZEL-GLYCERIN EX PADS: 1.0000 "application " | MEDICATED_PAD | CUTANEOUS | Status: DC | PRN

## 2016-12-26 MED ORDER — DIBUCAINE 1 % RE OINT: 1.0000 "application " | TOPICAL_OINTMENT | RECTAL | Status: DC | PRN

## 2016-12-26 MED ORDER — PENICILLIN G POT IN DEXTROSE 60000 UNIT/ML IV SOLN
3.0000 10*6.[IU] | INTRAVENOUS | Status: DC
  Administered 2016-12-26 (×3): 3 10*6.[IU] via INTRAVENOUS
  Filled 2016-12-26 (×5): qty 50

## 2016-12-26 MED ORDER — SENNOSIDES-DOCUSATE SODIUM 8.6-50 MG PO TABS
2.0000 | ORAL_TABLET | ORAL | Status: DC
  Administered 2016-12-27: 2 via ORAL
  Filled 2016-12-26: qty 2

## 2016-12-26 MED ORDER — LIDOCAINE HCL (PF) 1 % IJ SOLN
30.0000 mL | INTRAMUSCULAR | Status: DC | PRN
  Filled 2016-12-26: qty 30

## 2016-12-26 MED ORDER — PENICILLIN G POTASSIUM 5000000 UNITS IJ SOLR
5.0000 10*6.[IU] | Freq: Once | INTRAVENOUS | Status: AC
  Administered 2016-12-26: 5 10*6.[IU] via INTRAVENOUS
  Filled 2016-12-26: qty 5

## 2016-12-26 MED ORDER — LACTATED RINGERS IV SOLN: 500.0000 mL | Freq: Once | INTRAVENOUS | Status: DC

## 2016-12-26 MED ORDER — ONDANSETRON HCL 4 MG PO TABS: 4.0000 mg | ORAL_TABLET | ORAL | Status: DC | PRN

## 2016-12-26 MED ORDER — TETANUS-DIPHTH-ACELL PERTUSSIS 5-2.5-18.5 LF-MCG/0.5 IM SUSP: 0.5000 mL | Freq: Once | INTRAMUSCULAR | Status: DC

## 2016-12-26 MED ORDER — FENTANYL 2.5 MCG/ML BUPIVACAINE 1/10 % EPIDURAL INFUSION (WH - ANES)
INTRAMUSCULAR | Status: AC
  Filled 2016-12-26: qty 100

## 2016-12-26 MED ORDER — PHENYLEPHRINE 40 MCG/ML (10ML) SYRINGE FOR IV PUSH (FOR BLOOD PRESSURE SUPPORT)
PREFILLED_SYRINGE | INTRAVENOUS | Status: AC
  Filled 2016-12-26: qty 20

## 2016-12-26 MED ORDER — FENTANYL CITRATE (PF) 100 MCG/2ML IJ SOLN: 100.0000 ug | INTRAMUSCULAR | Status: DC | PRN

## 2016-12-26 MED ORDER — OXYTOCIN BOLUS FROM INFUSION
500.0000 mL | Freq: Once | INTRAVENOUS | Status: AC
  Administered 2016-12-26: 500 mL via INTRAVENOUS

## 2016-12-26 NOTE — H&P (Signed)
LABOR AND DELIVERY ADMISSION HISTORY AND PHYSICAL NOTE  Jaclyn Delgado is a 21 y.o. female G1P0000 with IUP at [redacted]w[redacted]d by LMP presenting for spontaneous onset of labor with regular uterine contractions of increasing intensity and frequency. She was evaluated in the MAU and found to have cervical change with regular uterine contractions.   She reports positive fetal movement. She denies leakage of fluid or vaginal bleeding.  Prenatal History/Complications: None  Past Medical History: Past Medical History:  Diagnosis Date  . Medical history non-contributory     Past Surgical History: Past Surgical History:  Procedure Laterality Date  . NO PAST SURGERIES      Obstetrical History: OB History    Gravida Para Term Preterm AB Living   1 0 0 0 0     SAB TAB Ectopic Multiple Live Births   0 0 0          Social History: Social History   Social History  . Marital status: Single    Spouse name: N/A  . Number of children: N/A  . Years of education: N/A   Social History Main Topics  . Smoking status: Never Smoker  . Smokeless tobacco: Never Used  . Alcohol use Yes  . Drug use: No  . Sexual activity: Yes   Other Topics Concern  . None   Social History Narrative   ** Merged History Encounter **        Family History: Family History  Problem Relation Age of Onset  . Renal Disease Mother     Allergies: Not on File  Prescriptions Prior to Admission  Medication Sig Dispense Refill Last Dose  . Prenatal Vit-Fe Fumarate-FA (PRENATAL MULTIVITAMIN) TABS tablet Take 1 tablet by mouth daily at 12 noon.   1500     Review of Systems   All systems reviewed and negative except as stated in HPI  Blood pressure 119/71, pulse 77, temperature 97.9 F (36.6 C), temperature source Oral, resp. rate 20, height 5\' 4"  (1.626 m), weight 125 lb (56.7 kg), last menstrual period 03/31/2016, SpO2 100 %. General appearance: alert, cooperative and no distress Lungs: clear to  auscultation bilaterally Heart: regular rate and rhythm Abdomen: soft, non-tender; bowel sounds normal Extremities: No calf swelling or tenderness Presentation: cephalic Fetal monitoring: Baseline in the 130s, + accels, intermittent variable decelerations, mod variability Uterine activity: contractions q 2-3 hrs Dilation: 3.5 Effacement (%): 70 Station: -2 Exam by:: Lanice Shirts RN    Prenatal labs: ABO, Rh: --/--/O POS (07/20 2221) Antibody: Negative (07/20 0000) Rubella: Immune RPR: Non Reactive (07/20 2221)  HBsAg: Negative (07/20 0000)  HIV: Non Reactive (07/20 2221)  GBS:   Positive 1 hr Glucola: 126 Genetic screening:  Normal Anatomy US: Normal  Prenatal Transfer Tool  Maternal Diabetes: No Genetic Screening: Normal Maternal Ultrasounds/Referrals: Normal Fetal Ultrasounds or other Referrals:  None Maternal Substance Abuse:  No Significant Maternal Medications:  None Significant Maternal Lab Results: Lab values include: Group B Strep positive  No results found for this or any previous visit (from the past 24 hour(s)).  Patient Active Problem List   Diagnosis Date Noted  . Pregnant and not yet delivered 12/26/2016    Assessment: Jaclyn Delgado is a 21 y.o. G1P0000 at [redacted]w[redacted]d here for spontaneous onset of labor, with variable decelerations on FHT.   #labor: Admitted with plan for expectant management at this time. If recurrent variables persist, patient will likely need placement of IUPC and possible amnioinfusion #Pain: Desires epidural when appropriate #  FWB: Cat I-II #ID:  GBS Positive- Penicillin #MOF: Breast and bottle #MOC:Depo #Circ:  N/a  Lise AuerMegan C Campbell, MD PGY-2 12/26/2016, 6:03 AM  The patient was seen and examined by me also Agree with note NST reactive and reassuring UCs as listed Cervical exams as listed in note  Aviva SignsMarie L Williams, CNM

## 2016-12-26 NOTE — Progress Notes (Signed)
Patient ID: Jaclyn Delgado, female   DOB: 12/11/1995, 21 y.o.   MRN: [id number]  Pt beginning to push  VSS, afeb FHR 140s, +accels, no decels, occ mi variables Ctx q 2-3 mins w/ Pit @ 384mu/min Vtx +2/3  IUP@term  End 1st stage  Will continue pushing Anticipate SVD  Cam HaiSHAW, KIMBERLY CNM 12/26/2016 8:56 PM

## 2016-12-26 NOTE — Anesthesia Procedure Notes (Signed)
Epidural Patient location during procedure: OB  Staffing Anesthesiologist: Sandrea Boer  Preanesthetic Checklist Completed: patient identified, site marked, surgical consent, pre-op evaluation, timeout performed, IV checked, risks and benefits discussed and monitors and equipment checked  Epidural Patient position: sitting Prep: site prepped and draped and DuraPrep Patient monitoring: continuous pulse ox and blood pressure Approach: midline Location: L3-L4 Injection technique: LOR air  Needle:  Needle type: Tuohy  Needle gauge: 17 G Needle length: 9 cm and 9 Needle insertion depth: 4 cm Catheter type: closed end flexible Catheter size: 19 Gauge Catheter at skin depth: 10 cm Test dose: negative  Assessment Events: blood not aspirated, injection not painful, no injection resistance, negative IV test and no paresthesia  Additional Notes Dosing of Epidural:  1st dose, through catheter .............................................  Xylocaine 40 mg  2nd dose, through catheter, after waiting 3 minutes.........Xylocaine 60 mg    As each dose occurred, patient was free of IV sx; and patient exhibited no evidence of SA injection.  Patient is more comfortable after epidural dosed. Please see RN's note for documentation of vital signs,and FHR which are stable.  Patient reminded not to try to ambulate with numb legs, and that an RN must be present when she attempts to get up.        

## 2016-12-26 NOTE — Anesthesia Preprocedure Evaluation (Signed)

## 2016-12-26 NOTE — Progress Notes (Signed)
Evaluated patient for progress of labor.  Comfortable on epidural. Last cervical check 9/90/-1.  Pitocin at 4. HR 140s, mod variability, pos accels, no decels contractions q2-3.585min. Anticipate SVD.

## 2016-12-26 NOTE — Progress Notes (Signed)
Evaluated patient for progress of labor.  Very comfortable with epidural.  Placed IUPC without complications. Cervical check 7cm, 80% effaced and contractions q2-383min.   Poor strength of contraction.  Started pitoctin 2x2 and will continue to monitor.  Anticipate SVD.

## 2016-12-26 NOTE — Progress Notes (Signed)
Stopped by to see patient and assess for progress of labor.  She is comfortable with epidural.  Contractions q2-3 min baseline HR 130s, mod variability, pos accels, no decels. AROM at 0015 without complications.  Cervix 7-7.5cm 80% effacement and -1 station.  Will continue to monitor. Anticipate SVD.

## 2016-12-26 NOTE — MAU Note (Signed)
Pt returns with worsening contractions.  

## 2016-12-27 MED ORDER — IBUPROFEN 100 MG/5ML PO SUSP
600.0000 mg | Freq: Four times a day (QID) | ORAL | Status: DC
  Administered 2016-12-27 – 2016-12-28 (×5): 600 mg via ORAL
  Filled 2016-12-27 (×10): qty 30

## 2016-12-27 NOTE — Progress Notes (Signed)
POSTPARTUM PROGRESS NOTE  Post Partum Day 1  Subjective:  Jaclyn Delgado is a 21 y.o. G1P1001 6236w4d s/p SVD.  No acute events overnight.  Pt denies problems with ambulating, voiding or po intake.  She denies nausea or vomiting.  Pain is well controlled and she has not had to take any PRN medications for pain.  She has had flatus. She has not had bowel movement.  Lochia Moderate.   Objective: Blood pressure 105/75, pulse 90, temperature 97.8 F (36.6 C), temperature source Oral, resp. rate 18, height 5\' 4"  (1.626 m), weight 56.7 kg (125 lb), last menstrual period 03/31/2016, SpO2 100 %, unknown if currently breastfeeding.  Physical Exam:  General: alert, cooperative and no distress Lochia:normal flow Chest: CTAB Heart: RRR no m/r/g Abdomen: +BS, soft, nontender,  Uterine Fundus: firm, 1-2 cm below level of umbilicus DVT Evaluation: No calf swelling or tenderness Extremities: no lower extremity edema   Recent Labs (last 2 labs)    Recent Labs  12/26/16 0551  HGB 10.6*  HCT 31.2*      Assessment/Plan:  ASSESSMENT: Jaclyn Delgado is a [age] y.o. G1P1001 136w4d s/p SVD.  Doing well today with well controlled pain and no specific concerns at this time.  Plan for discharge tomorrow, Breastfeeding, Lactation consult and Contraception depo   LOS: 1 day   Daniel L. Myrtie SomanWarden, MD [facility] Resident PGY-1 12/27/2016 8:02 AM    OB FELLOW POSTPARTUM PROGRESS NOTE ATTESTATION  I have seen and examined this patient and agree with above documentation in the resident's note.   Ernestina PennaNicholas Schenk, MD 10:04 AM

## 2016-12-27 NOTE — Anesthesia Postprocedure Evaluation (Addendum)
Anesthesia Post Note  Patient: Alonna MiniumLadajah R Study  Procedure(s) Performed: * No procedures listed *  Patient location during evaluation: Mother Baby Anesthesia Type: Epidural Level of consciousness: awake and alert and oriented Pain management: satisfactory to patient Vital Signs Assessment: post-procedure vital signs reviewed and stable Respiratory status: spontaneous breathing and nonlabored ventilation Cardiovascular status: stable Postop Assessment: no headache, no backache, no signs of nausea or vomiting, adequate PO intake and patient able to bend at knees (patient up walking) Anesthetic complications: no        Last Vitals:  Vitals:   12/27/16 0030 12/27/16 0430  BP: 113/62 105/75  Pulse: 76 90  Resp: 18 18  Temp: 36.6 C 36.6 C    Last Pain:  Vitals:   12/27/16 0657  TempSrc:   PainSc: 3    Pain Goal:                 Madison HickmanGREGORY,SUZANNE

## 2016-12-27 NOTE — Lactation Note (Signed)
This note was copied from a baby's chart. Lactation Consultation Note  Patient Name: Jaclyn Delgado [id number]'ZToday's Date: 12/27/2016 Reason for consult: Initial assessment   Report to [place], RN about feeding plan and assisting mom with spoon feeding infant when mom has colostrum available.    Maternal Data Formula Feeding for Exclusion: Yes Reason for exclusion: Mother's choice to formula and breast feed on admission Has patient been taught Hand Expression?: Yes Does the patient have breastfeeding experience prior to this delivery?: No  Feeding Feeding Type: Breast Fed Length of feed: 20 min  LATCH Score/Interventions Latch: Repeated attempts needed to sustain latch, nipple held in mouth throughout feeding, stimulation needed to elicit sucking reflex. Intervention(s): Adjust position;Assist with latch;Breast massage;Breast compression  Audible Swallowing: A few with stimulation Intervention(s): Skin to skin;Hand expression;Alternate breast massage  Type of Nipple: Everted at rest and after stimulation  Comfort (Breast/Nipple): Filling, red/small blisters or bruises, mild/mod discomfort  Problem noted: Mild/Moderate discomfort  Hold (Positioning): Assistance needed to correctly position infant at breast and maintain latch. Intervention(s): Breastfeeding basics reviewed;Support Pillows;Position options;Skin to skin  LATCH Score: 6  Lactation Tools Discussed/Used WIC Program: Yes Pump Review: Setup, frequency, and cleaning;Milk Storage Initiated by:: Noralee StainSharon Baljit Liebert, RN, IBCLC Date initiated:: 12/27/16   Consult Status Consult Status: Follow-up Date: 12/28/16 Follow-up type: In-patient    Silas FloodSharon S Catheline Hixon 12/27/2016, 2:10 PM

## 2016-12-27 NOTE — Lactation Note (Signed)
This note was copied from a baby's chart. Lactation Consultation Note  Patient Name: Jaclyn Delgado WUJWJ'XToday's Date: 12/27/2016 Reason for consult: Initial assessment   Initial assessment with mom of 14 hour old infant. Infant sleeping on mom's chest and has not fed since around 6 this morning per mom. Infant with 3 BF for 10-45 minutes, 3 stools and 0 voids since birth. Mom reports infant voided right after delivery. LATCH Scores 7-8. Infant weight 6 lb 1.7 oz.  Enc mom to feed infant STS 8-12 x in 24 hours at first feeding cues. Enc mom to undress and place infant STS if infant not showing feeding cues. Enc mom to hans express and spoon feed infant colostrum as available since infant just over 6 pounds. Mom reports she is having some nipple soreness at beginning and sometimes through out feeding.   Awakened infant to feed. She awakened with diaper change. Mom with compressible breasts and areola with everted nipples. Nipples intact without bruising or redness. Colostrum easily expressible from both breasts, mom is able to hand express. Assisted mom in latching her to the right breast in the football hold. Infant initially sleepy at breast, enc mom to massage/compress breast with feeding. Infant became more active with feeding as feeding progressed. She fed for 20 minutes and then self detached. We then latched her to left breast in the football hold, infant more actively nursing on this side and noted to have increased swallows with breast compression. Infant was still feeding when I left the room.   Mom reports she wanted to try breast and formula feeding. She is not sure when she plans to start formula. Discussed with mom supply and demand, cluster feeding, positioning, hand expression, colostrum, and milk coming to volume. Discussed with mom that infant likely to be less than 6 pounds in the next few days and offered to set up DEBP to stimulate milk to come in and to use any EBM to supplement  infant. MOm wishes to start pumping, DEBP set up and mom shown how to use on Initiate setting, assembling, disassembling and cleaning of pump parts. Enc mom to pump after infant BF for 15 minutes. Mom has visitors currently and will start pumping later. Enc mom to hans express post pumping. Mom will need to be shown how to spoon or syringe feed infant.   BF Resources Handout and LC Brochure given, mom informed of IP/OP Services, BF Support groups and LC Brochure. [facility] and is aware to call and make appt post d/c. Mom has a PIS at home that she received through her insurance company.    Maternal Data Formula Feeding for Exclusion: Yes Reason for exclusion: Mother's choice to formula and breast feed on admission Has patient been taught Hand Expression?: Yes Does the patient have breastfeeding experience prior to this delivery?: No  Feeding Feeding Type: Breast Fed Length of feed: 20 min  LATCH Score/Interventions Latch: Repeated attempts needed to sustain latch, nipple held in mouth throughout feeding, stimulation needed to elicit sucking reflex. Intervention(s): Adjust position;Assist with latch;Breast massage;Breast compression  Audible Swallowing: A few with stimulation Intervention(s): Skin to skin;Hand expression;Alternate breast massage  Type of Nipple: Everted at rest and after stimulation  Comfort (Breast/Nipple): Filling, red/small blisters or bruises, mild/mod discomfort  Problem noted: Mild/Moderate discomfort  Hold (Positioning): Assistance needed to correctly position infant at breast and maintain latch. Intervention(s): Breastfeeding basics reviewed;Support Pillows;Position options;Skin to skin  LATCH Score: 6  Lactation Tools Discussed/Used [facility]  Program: Yes Pump Review: Setup, frequency, and cleaning;Milk Storage Initiated by:: Noralee Stain, RN, IBCLC Date initiated:: 12/27/16   Consult Status Consult Status: Follow-up Date: 12/28/16 Follow-up  type: In-patient    Jaclyn Delgado 12/27/2016, 12:53 PM

## 2016-12-28 DIAGNOSIS — Z3A38 38 weeks gestation of pregnancy: Secondary | ICD-10-CM

## 2016-12-28 MED ORDER — IBUPROFEN 100 MG/5ML PO SUSP: 600.0000 mg | Freq: Four times a day (QID) | ORAL | 0 refills | Status: DC

## 2016-12-28 MED ORDER — SENNOSIDES-DOCUSATE SODIUM 8.6-50 MG PO TABS
2.0000 | ORAL_TABLET | ORAL | 0 refills | Status: DC
Start: 2016-12-29 — End: 2018-03-14

## 2016-12-28 NOTE — Discharge Summary (Signed)
OB Discharge Summary     Patient Name: Jaclyn Delgado DOB: 01/17/1996 MRN: [id number]  Date of admission: 12/26/2016 Delivering MD: Gorden HarmsAMPBELL, Hitoshi Werts C   Date of discharge: 12/28/2016  Admitting diagnosis: 7032w4d, Contractions  Intrauterine pregnancy: 432w4d     Secondary diagnosis:  Active Problems:   Pregnant and not yet delivered   SVD (spontaneous vaginal delivery)  Additional problems: None     Discharge diagnosis: Term Pregnancy Delivered                                                                                                Post partum procedures:None  Augmentation: AROM and Pitocin  Complications: None  Hospital course:  Onset of Labor With Vaginal Delivery     21 y.o. yo G1P1001 at 7732w4d was admitted in Latent Labor on 12/26/2016. Patient had an uncomplicated labor course as follows:  Membrane Rupture Time/Date: 12:01 PM ,12/26/2016   Intrapartum Procedures: Episiotomy: None [1]                                         Lacerations:  None [1]  Patient had a delivery of a Viable infant. 12/26/2016  Information for the patient's newborn:  Georgann HousekeeperMeadows, Girl Varnika [[id number]][[id number]]  Delivery Method: Vaginal, Spontaneous Delivery (Filed from Delivery Summary)    Pateint had an uncomplicated postpartum course.  She is ambulating, tolerating a regular diet, passing flatus, and urinating well. Patient is discharged home in stable condition on 12/28/16.   Physical exam  Vitals:   12/27/16 0030 12/27/16 0430 12/27/16 1837 12/28/16 0542  BP: 113/62 105/75 117/66 101/60  Pulse: 76 90 84 80  Resp: 18 18 18 18   Temp: 97.9 F (36.6 C) 97.8 F (36.6 C)  98.2 F (36.8 C)  TempSrc: Oral Oral Oral   SpO2:      Weight:      Height:       General: alert, cooperative and no distress Lochia: appropriate Uterine Fundus: firm Incision: N/A DVT Evaluation: No evidence of DVT seen on physical exam. No significant calf/ankle edema. Labs: Lab Results  Component Value Date   WBC  5.2 12/26/2016   HGB 10.6 (L) 12/26/2016   HCT 31.2 (L) 12/26/2016   MCV 84.6 12/26/2016   PLT 256 12/26/2016   CMP Latest Ref Rng & Units 01/02/2015  Glucose 70 - 99 mg/dL 97  BUN 6 - 23 mg/dL 8  Creatinine 2.950.50 - 2.841.10 mg/dL 1.320.58  Sodium 440135 - 102145 mmol/L 137  Potassium 3.5 - 5.1 mmol/L 3.7  Chloride 96 - 112 mmol/L 104  CO2 19 - 32 mmol/L 30  Calcium 8.4 - 10.5 mg/dL 9.1  Total Protein 6.0 - 8.3 g/dL 7.4  Total Bilirubin 0.3 - 1.2 mg/dL 0.6  Alkaline Phos 39 - 117 U/L 57  AST 0 - 37 U/L 22  ALT 0 - 35 U/L 11    Discharge instruction: per After Visit Summary and "Baby and Me Booklet".  After visit meds:  Allergies as  of 12/28/2016   No Known Allergies     Medication List    TAKE these medications   calcium carbonate 500 MG chewable tablet Commonly known as:  TUMS - dosed in mg elemental calcium Chew 2 tablets by mouth daily as needed for indigestion or heartburn.   ibuprofen 100 MG/5ML suspension Commonly known as:  ADVIL,MOTRIN Take 30 mLs (600 mg total) by mouth every 6 (six) hours.   prenatal multivitamin Tabs tablet Take 1 tablet by mouth daily at 12 noon.   senna-docusate 8.6-50 MG tablet Commonly known as:  Senokot-S Take 2 tablets by mouth daily. Start taking on:  12/29/2016       Diet: routine diet  Activity: Advance as tolerated. Pelvic rest for 6 weeks.   Outpatient follow up:6 weeks Follow up Appt:No future appointments. Follow up Visit:No Follow-up on file.  Postpartum contraception: Depo Provera  Newborn Data: Live born female  Birth Weight: 6 lb 1.7 oz (2770 g) APGAR: 8, 9  Baby Feeding: Breast Disposition:home with mother   12/28/2016 Gorden Harms, MD

## 2016-12-28 NOTE — Discharge Instructions (Signed)

## 2016-12-28 NOTE — Lactation Note (Signed)
This note was copied from a baby's chart. Lactation Consultation Note; Dad bottle feeding formula as I went into room. Mom reports her nipples are sore. She has coconut oil. Has been giving formula at some feedings through the night. Has DEBP setup in room. Reports she did not get any thing when she pumped- reassurance given. Has Medela pump for home. Mom wants to try latching baby to breast. She nursed for a few minutes. I untucked her bottom lip and mom reports that feels better. Encouraged frequent breast feeding to promote milk supply. Reviewed engorgement prevention and treatment. No questions at present. Reviewed our phone number to call with questions, OP appointments and BFSG as resources for support after DC. To call prn  Patient Name: Jaclyn Delgado VQQVZ'DToday's Date: 12/28/2016 Reason for consult: Follow-up assessment   Maternal Data Formula Feeding for Exclusion: Yes Reason for exclusion: Mother's choice to formula and breast feed on admission Does the patient have breastfeeding experience prior to this delivery?: No  Feeding Feeding Type: Breast Fed  LATCH Score/Interventions Latch: Grasps breast easily, tongue down, lips flanged, rhythmical sucking.  Audible Swallowing: None  Type of Nipple: Everted at rest and after stimulation  Comfort (Breast/Nipple): Filling, red/small blisters or bruises, mild/mod discomfort  Problem noted: Mild/Moderate discomfort Interventions (Mild/moderate discomfort): Hand expression (coconut oil)  Hold (Positioning): Assistance needed to correctly position infant at breast and maintain latch. Intervention(s): Breastfeeding basics reviewed  LATCH Score: 6  Lactation Tools Discussed/Used     Consult Status Consult Status: Complete    Pamelia HoitWeeks, Lyndsie Wallman D 12/28/2016, 8:48 AM

## 2017-04-26 NOTE — Addendum Note (Signed)
Addendum  created 04/26/17 0951 by Shelton SilvasHollis, Kemani Demarais D, MD   Sign clinical note

## 2017-11-26 NOTE — L&D Delivery Note (Addendum)
Patient: Jaclyn Delgado MRN: [id number]  GBS status: Negative  Patient is a 22 y.o. now Q6V7846 s/p NSVD at [redacted]w[redacted]d, who was admitted for SOL. SROM 0h 74m prior to delivery with clear fluid.    Delivery Note At 9:18 AM a viable female was delivered via Vaginal, Spontaneous (Presentation: ROA).  APGAR: 9, 9; weight 6 lb 6.6 oz (2909 g).   Placenta status: Intact, spontaneous  Cord: Thin cord, intact and 3 vessel. with the following complications: None   Anesthesia: Epidural  Episiotomy: None Lacerations: None Suture Repair: N/A Est. Blood Loss (mL): 143  Mom to postpartum.  Baby to Couplet care / Skin to Skin.  Head delivered Right OA. No nuchal cord present. Shoulder and body delivered in usual fashion. Infant with spontaneous cry, placed on mother's abdomen, dried and bulb suctioned. Cord clamped x 2 after 1 minute delay, and cut by family member. Cord blood drawn. Placenta delivered spontaneously with gentle cord traction. Fundus firm with massage and Pitocin. Perineum inspected and found to have no lacerations.  Allayne [facility] PGY-1  09/05/2018, 12:16 PM   I attest that I was present and gloved for the delivery of infant and placenta, and I agree with the above documentation and findings.   Luna Kitchens CNM  Please schedule this patient for Postpartum visit in: 6 week with the following provider: Any provider  For C/S patients schedule nurse incision check in weeks 2 weeks: no  Low risk pregnancy complicated by: nothing  Delivery mode: SVD  Anticipated Birth Control: Patch   PP Procedures needed: none  Schedule Integrated BH visit: no

## 2018-02-05 ENCOUNTER — Encounter: Payer: Self-pay | Admitting: Obstetrics & Gynecology

## 2018-02-05 ENCOUNTER — Ambulatory Visit (INDEPENDENT_AMBULATORY_CARE_PROVIDER_SITE_OTHER): Payer: Self-pay | Admitting: General Practice

## 2018-02-05 DIAGNOSIS — Z3201 Encounter for pregnancy test, result positive: Secondary | ICD-10-CM

## 2018-02-05 LAB — POCT PREGNANCY, URINE: Preg Test, Ur: POSITIVE — AB

## 2018-02-05 NOTE — Progress Notes (Signed)
Patient here for UPT today. UPT +. Patient reports first positive home test February 6th. LMP 11/29/17 EDD 09/05/18 5556w5d. Patient denies taking any meds, only PNV. Recommended she begin OB care. Patient verbalized understanding & had no questions

## 2018-02-16 NOTE — Progress Notes (Signed)
I was consulted, reviewed results and agree w/ POC.  Katrinka BlazingSmith, [place], CNM 02/16/2018 10:12 AM

## 2018-02-20 ENCOUNTER — Ambulatory Visit (INDEPENDENT_AMBULATORY_CARE_PROVIDER_SITE_OTHER): Payer: Managed Care, Other (non HMO) | Admitting: Student

## 2018-02-20 ENCOUNTER — Other Ambulatory Visit (HOSPITAL_COMMUNITY)
Admission: RE | Admit: 2018-02-20 | Discharge: 2018-02-20 | Disposition: A | Discharge status: Home / Self Care | Payer: Managed Care, Other (non HMO) | Source: Ambulatory Visit | Attending: Student | Admitting: Student

## 2018-02-20 ENCOUNTER — Encounter: Payer: Self-pay | Admitting: Student

## 2018-02-20 VITALS — BP 127/86 | HR 78 | Wt 105.9 lb

## 2018-02-20 DIAGNOSIS — Z3481 Encounter for supervision of other normal pregnancy, first trimester: Secondary | ICD-10-CM

## 2018-02-20 DIAGNOSIS — Z3490 Encounter for supervision of normal pregnancy, unspecified, unspecified trimester: Secondary | ICD-10-CM | POA: Insufficient documentation

## 2018-02-20 DIAGNOSIS — R87612 Low grade squamous intraepithelial lesion on cytologic smear of cervix (LGSIL): Secondary | ICD-10-CM

## 2018-02-20 DIAGNOSIS — O09891 Supervision of other high risk pregnancies, first trimester: Secondary | ICD-10-CM

## 2018-02-20 DIAGNOSIS — R87619 Unspecified abnormal cytological findings in specimens from cervix uteri: Secondary | ICD-10-CM | POA: Insufficient documentation

## 2018-02-20 NOTE — Patient Instructions (Signed)

## 2018-02-20 NOTE — Progress Notes (Signed)
Subjective:   Jaclyn Delgado is a 21 y.o. G3P1011 at [redacted]w[redacted]d by LMP being seen today for her first obstetrical visit.  Her obstetrical history is significant for short interval between pregnancies. . Patient does not intend to breast feed. Pregnancy history fully reviewed.  Patient reports no complaints.  HISTORY: OB History  Gravida Para Term Preterm AB Living  3 1 1  0 1 1  SAB TAB Ectopic Multiple Live Births  0 0 0 0 1    # Outcome Date GA Lbr Len/2nd Weight Sex Delivery Anes PTL Lv  3 Current           2 Term 12/26/16 [redacted]w[redacted]d 19:30 / 01:19 6 lb 1.7 oz (2.77 kg) F Vag-Spont EPI  LIV     Name: Ashenfelter,GIRL [facility]     Apgar1: 8  Apgar5: 9  1 AB 2016            Last pap smear was done 06/2016 and was abnormal - LGSIL  Past Medical History:  Diagnosis Date  . Medical history non-contributory    Past Surgical History:  Procedure Laterality Date  . NO PAST SURGERIES     Family History  Problem Relation Age of Onset  . Renal Disease Mother   . Bipolar disorder Mother    Social History   Tobacco Use  . Smoking status: Never Smoker  . Smokeless tobacco: Never Used  Substance Use Topics  . Alcohol use: Not Currently  . Drug use: No   No Known Allergies Current Outpatient Medications on File Prior to Visit  Medication Sig Dispense Refill  . Prenatal Vit-Fe Fumarate-FA (PRENATAL MULTIVITAMIN) TABS tablet Take 1 tablet by mouth daily at 12 noon.    . calcium carbonate (TUMS - DOSED IN MG ELEMENTAL CALCIUM) 500 MG chewable tablet Chew 2 tablets by mouth daily as needed for indigestion or heartburn.    Marland Kitchen ibuprofen (ADVIL,MOTRIN) 100 MG/5ML suspension Take 30 mLs (600 mg total) by mouth every 6 (six) hours. (Patient not taking: Reported on 02/20/2018) 30 mL 0  . senna-docusate (SENOKOT-S) 8.6-50 MG tablet Take 2 tablets by mouth daily. 30 tablet 0   No current facility-administered medications on file prior to visit.     Review of Systems Pertinent items noted in HPI  and remainder of comprehensive ROS otherwise negative.  Exam   Vitals:   02/20/18 1616  BP: 127/86  Pulse: 78  Weight: 105 lb 14.4 oz (48 kg)   Fetal Heart Rate (bpm): 163  Uterus:   enlarged ~10-12 wks size. Non tender.   Pelvic Exam: Perineum: no hemorrhoids, normal perineum   Vulva: normal external genitalia, no lesions   Vagina:  normal mucosa, normal discharge   Cervix: no lesions and normal, pap smear done. Cervix closed.    Adnexa: normal adnexa and no mass, fullness, tenderness   Bony Pelvis: average  System: General: well-developed, well-nourished female in no acute distress   Breast:  normal appearance, no masses or tenderness   Skin: normal coloration and turgor, no rashes   Neurologic: oriented, normal, negative, normal mood   Extremities: normal strength, tone, and muscle mass, ROM of all joints is normal   HEENT PERRLA, extraocular movement intact and sclera clear, anicteric   Mouth/Teeth mucous membranes moist, pharynx normal without lesions and dental hygiene good   Neck supple and no masses   Cardiovascular: regular rate and rhythm   Respiratory:  no respiratory distress, normal breath sounds   Abdomen: soft, non-tender;  bowel sounds normal; no masses,  no organomegaly     Assessment:   Pregnancy: G3P1011 Patient Active Problem List   Diagnosis Date Noted  . Supervision of low-risk pregnancy 02/20/2018  . Short interval between pregnancies affecting pregnancy in first trimester, antepartum 02/20/2018  . Abnormal Papanicolaou smear of cervix 02/20/2018     Plan:  1. Encounter for supervision of other normal pregnancy in first trimester  - Culture, OB Urine - SMN1 COPY NUMBER ANALYSIS (SMA Carrier Screen) - Cytology - PAP - Obstetric Panel, Including HIV - [facility] AMB BABYSCRIPTS SCHEDULE OPTIMIZATION  2. Short interval between pregnancies affecting pregnancy in first trimester, antepartum   3. Low grade squamous intraepithelial lesion on cytologic  smear of cervix (LGSIL) -Pap collected today   Initial labs drawn. Continue prenatal vitamins. Genetic Screening discussed, NIPS: ordered. Ultrasound discussed; fetal anatomic survey: will scheduled next visit. Problem list reviewed and updated. The nature [facility] with multiple MDs and other Advanced Practice Providers was explained to patient; also emphasized that residents, students are part of our team. Routine obstetric precautions reviewed. Return in about 1 month (around 03/20/2018) for Routine OB.   Judeth HornErin Franki [facility],FNP-C [facility]

## 2018-02-20 NOTE — Progress Notes (Signed)
Given new OB pack. Signed up for MyChart.Signed up for Babyscripts optimization. PMH form completed.

## 2018-02-21 ENCOUNTER — Encounter: Payer: Self-pay | Admitting: *Deleted

## 2018-02-21 LAB — POCT URINALYSIS DIP (DEVICE)
GLUCOSE, UA: NEGATIVE mg/dL
Hgb urine dipstick: NEGATIVE
Leukocytes, UA: NEGATIVE
NITRITE: NEGATIVE
PH: 5.5 (ref 5.0–8.0)
Protein, ur: 30 mg/dL — AB
Specific Gravity, Urine: >=1.03 (ref 1.005–1.030)
UROBILINOGEN UA: 0.2 mg/dL (ref 0.0–1.0)

## 2018-02-23 LAB — URINE CULTURE, OB REFLEX

## 2018-02-23 LAB — CULTURE, OB URINE

## 2018-02-25 LAB — CYTOLOGY - PAP
CHLAMYDIA, DNA PROBE: NEGATIVE
Diagnosis: NEGATIVE
Neisseria Gonorrhea: NEGATIVE

## 2018-02-26 ENCOUNTER — Encounter: Payer: Self-pay | Admitting: *Deleted

## 2018-02-26 ENCOUNTER — Encounter (INDEPENDENT_AMBULATORY_CARE_PROVIDER_SITE_OTHER): Payer: Self-pay

## 2018-03-01 ENCOUNTER — Encounter: Payer: Self-pay | Admitting: Student

## 2018-03-01 DIAGNOSIS — O09899 Supervision of other high risk pregnancies, unspecified trimester: Secondary | ICD-10-CM | POA: Insufficient documentation

## 2018-03-01 DIAGNOSIS — Z2839 Other underimmunization status: Secondary | ICD-10-CM | POA: Insufficient documentation

## 2018-03-01 DIAGNOSIS — Z283 Underimmunization status: Secondary | ICD-10-CM | POA: Insufficient documentation

## 2018-03-01 DIAGNOSIS — O9989 Other specified diseases and conditions complicating pregnancy, childbirth and the puerperium: Secondary | ICD-10-CM

## 2018-03-01 LAB — OBSTETRIC PANEL, INCLUDING HIV
Antibody Screen: NEGATIVE
BASOS ABS: 0 10*3/uL (ref 0.0–0.2)
BASOS: 1 %
EOS (ABSOLUTE): 0.1 10*3/uL (ref 0.0–0.4)
Eos: 1 %
HEMATOCRIT: 34.8 % (ref 34.0–46.6)
HIV Screen 4th Generation wRfx: NONREACTIVE
Hemoglobin: 12 g/dL (ref 11.1–15.9)
Hepatitis B Surface Ag: NEGATIVE
Immature Grans (Abs): 0 10*3/uL (ref 0.0–0.1)
Immature Granulocytes: 0 %
Lymphocytes Absolute: 1.7 10*3/uL (ref 0.7–3.1)
Lymphs: 37 %
MCH: 29.8 pg (ref 26.6–33.0)
MCHC: 34.5 g/dL (ref 31.5–35.7)
MCV: 86 fL (ref 79–97)
Monocytes Absolute: 0.4 10*3/uL (ref 0.1–0.9)
Monocytes: 8 %
Neutrophils Absolute: 2.5 10*3/uL (ref 1.4–7.0)
Neutrophils: 53 %
PLATELETS: 285 10*3/uL (ref 150–379)
RBC: 4.03 x10E6/uL (ref 3.77–5.28)
RDW: 14.1 % (ref 12.3–15.4)
RPR: NONREACTIVE
Rh Factor: POSITIVE
WBC: 4.7 10*3/uL (ref 3.4–10.8)

## 2018-03-01 LAB — SMN1 COPY NUMBER ANALYSIS (SMA CARRIER SCREENING)

## 2018-03-14 ENCOUNTER — Inpatient Hospital Stay (HOSPITAL_COMMUNITY)
Admission: AD | Admit: 2018-03-14 | Discharge: 2018-03-14 | Disposition: A | Discharge status: Home / Self Care | Payer: Managed Care, Other (non HMO) | Source: Ambulatory Visit | Attending: Family Medicine | Admitting: Family Medicine

## 2018-03-14 ENCOUNTER — Encounter (HOSPITAL_COMMUNITY): Payer: Self-pay | Admitting: *Deleted

## 2018-03-14 ENCOUNTER — Other Ambulatory Visit: Payer: Self-pay

## 2018-03-14 DIAGNOSIS — R42 Dizziness and giddiness: Secondary | ICD-10-CM | POA: Diagnosis present

## 2018-03-14 DIAGNOSIS — O26892 Other specified pregnancy related conditions, second trimester: Secondary | ICD-10-CM | POA: Diagnosis not present

## 2018-03-14 DIAGNOSIS — O9989 Other specified diseases and conditions complicating pregnancy, childbirth and the puerperium: Secondary | ICD-10-CM

## 2018-03-14 DIAGNOSIS — Z3A15 15 weeks gestation of pregnancy: Secondary | ICD-10-CM | POA: Insufficient documentation

## 2018-03-14 LAB — URINALYSIS, ROUTINE W REFLEX MICROSCOPIC
BACTERIA UA: NONE SEEN
BILIRUBIN URINE: NEGATIVE
Glucose, UA: NEGATIVE mg/dL
Hgb urine dipstick: NEGATIVE
KETONES UR: NEGATIVE mg/dL
Nitrite: NEGATIVE
PH: 7 (ref 5.0–8.0)
PROTEIN: NEGATIVE mg/dL
Specific Gravity, Urine: 1.005 (ref 1.005–1.030)

## 2018-03-14 NOTE — Discharge Instructions (Signed)
Remember to eat small frequent meals/snacks every 2-3 hours. Have protein-rich snacks on hand.

## 2018-03-14 NOTE — MAU Note (Signed)
Patient arrived by EMS. Had near-fainting spell at work. Patient had a headache that affected her vision yesterday. Her vital signs are within normal limits, and Patient reports that she feels better today.

## 2018-03-14 NOTE — MAU Note (Signed)
Urine in lab 

## 2018-03-14 NOTE — Progress Notes (Signed)
All discharge teaching completed with the patient. Patient verbalizes an understanding of all instructions. All printed instructions given and explained. Patient denies any questions or concerns.

## 2018-03-14 NOTE — MAU Provider Note (Signed)
History     CSN: [id number]  Arrival date and time: 03/14/18 1105   First Provider Initiated Contact with Patient 03/14/18 1308      Chief Complaint  Patient presents with  . Dizziness   HPI  Ms.  Alonna MiniumLadajah R Loyd is a [age] y.o. year old 673P1011 female at 3059w0d weeks gestation who presents to MAU via EMS reporting nearly passing out while at work today around 1055. She reports she ate a [employer]opt[employer] just before she got to work this morning, because she was "running late". 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 She reports that as she was standing at her register, she had a hot flash then starting losing her peripheral vision. She immediately got light headed. Her [facility]. She "feels much better now". She denies N/V. She reports good (+) FM today.  Past Medical History:  Diagnosis Date  . Medical history non-contributory     Past Surgical History:  Procedure Laterality Date  . NO PAST SURGERIES      Family History  Problem Relation Age of Onset  . Renal Disease Mother   . Bipolar disorder Mother     Social History   Tobacco Use  . Smoking status: Never Smoker  . Smokeless tobacco: Never Used  Substance Use Topics  . Alcohol use: Not Currently  . Drug use: No    Allergies: No Known Allergies  Medications Prior to Admission  Medication Sig Dispense Refill Last Dose  . acetaminophen (TYLENOL) 500 MG tablet Take 500 mg by mouth every 6 (six) hours as needed.   03/13/2018 at Unknown time  . Prenatal Vit-Fe Fumarate-FA (PRENATAL MULTIVITAMIN) TABS tablet Take 1 tablet by mouth daily at 12 noon.   03/13/2018 at Unknown time    Review of Systems  Constitutional: Negative.   HENT: Negative.   Eyes: Negative.   Respiratory: Negative.   Cardiovascular: Negative.   Gastrointestinal: Negative.   Endocrine: Negative.   Genitourinary: Negative.   Musculoskeletal: Negative.   Skin: Negative.   Allergic/Immunologic: Negative.    Neurological: Positive for dizziness, weakness and light-headedness. Syncope: "near fainting"  Hematological: Negative.   Psychiatric/Behavioral: Negative.    Physical Exam   Blood pressure 105/76, pulse 82, temperature (!) 97.5 F (36.4 C), temperature source Oral, resp. rate 16, height 5\' 4"  (1.626 m), weight 108 lb (49 kg), last menstrual period 11/29/2017.  Physical Exam  Nursing note and vitals reviewed. Constitutional: She is oriented to person, place, and time. She appears well-developed and well-nourished.  HENT:  Head: Normocephalic and atraumatic.  Neck: Normal range of motion.  Cardiovascular: Normal rate, regular rhythm and normal heart sounds.  Respiratory: Effort normal and breath sounds normal.  GI: Soft. Bowel sounds are normal.  Genitourinary:  Genitourinary Comments: Pelvic deferred  Musculoskeletal: Normal range of motion.  Neurological: She is alert and oriented to person, place, and time.  Skin: Skin is warm and dry.  Psychiatric: She has a normal mood and affect. Her behavior is normal. Judgment and thought content normal.    MAU Course  Procedures  MDM CCUA FHTs by doppler: 154 bpm   Urinalysis: Normal Range 03/14/2018   Color, Urine YELLOW YELLOW    APPearance CLEAR CLEAR    Specific Gravity, Urine 1.005 - 1.030 1.005    pH 5.0 - 8.0 7.0    Glucose, UA NEGATIVE mg/dL NEGATIVE    Hgb urine dipstick NEGATIVE NEGATIVE    Bilirubin Urine NEGATIVE  NEGATIVE    Ketones, ur NEGATIVE mg/dL NEGATIVE    Protein, ur NEGATIVE mg/dL NEGATIVE    Nitrite NEGATIVE NEGATIVE    Leukocytes, UA NEGATIVE MODERATEAbnormal     RBC / HPF 0 - 5 RBC/hpf 0-5    WBC, UA 0 - 5 WBC/hpf 6-30    Bacteria, UA NONE SEEN NONE SEEN    Squamous Epithelial / LPF NONE SEEN 0-5Abnormal     Mucus  PRESENT      Assessment and Plan  Episodic lightheadedness  - Advised to eat protein-rich diet, educational information provided - Recommend eating small, frequent meal/snack every 2-3  hours - Stay well-hydrated with 8-10 glasses of water daily - Discharge patient - Patient verbalized an understanding of the plan of care and agrees.   Raelyn Mora,  MSN, CNM 03/14/2018, 1:33 PM

## 2018-03-21 ENCOUNTER — Encounter: Payer: Medicaid Other | Admitting: Advanced Practice Midwife

## 2018-03-28 ENCOUNTER — Encounter: Payer: Self-pay | Admitting: *Deleted

## 2018-03-28 ENCOUNTER — Ambulatory Visit (INDEPENDENT_AMBULATORY_CARE_PROVIDER_SITE_OTHER): Payer: Managed Care, Other (non HMO) | Admitting: Student

## 2018-03-28 DIAGNOSIS — Z3492 Encounter for supervision of normal pregnancy, unspecified, second trimester: Secondary | ICD-10-CM

## 2018-03-28 NOTE — Progress Notes (Signed)
   PRENATAL VISIT NOTE  Subjective:  Jaclyn Delgado is a 22 y.o. G3P1011 at [redacted]w[redacted]d being seen today for ongoing prenatal care.  She is currently monitored for the following issues for this low-risk pregnancy and has Supervision of low-risk pregnancy; Short interval between pregnancies affecting pregnancy in first trimester, antepartum; Abnormal Papanicolaou smear of cervix; Rubella non-immune status, antepartum; and Episodic lightheadedness on their problem list.  Patient reports no complaints.  Contractions: Not present. Vag. Bleeding: None.  Movement: Absent. Denies leaking of fluid.   The following portions of the patient's history were reviewed and updated as appropriate: allergies, current medications, past family history, past medical history, past social history, past surgical history and problem list. Problem list updated.  Objective:   Vitals:   03/28/18 1325  BP: 120/62  Pulse: 75  Weight: 109 lb 4.8 oz (49.6 kg)    Fetal Status: Fetal Heart Rate (bpm): 156   Movement: Absent     General:  Alert, oriented and cooperative. Patient is in no acute distress.  Skin: Skin is warm and dry. No rash noted.   Cardiovascular: Normal heart rate noted  Respiratory: Normal respiratory effort, no problems with respiration noted  Abdomen: Soft, gravid, appropriate for gestational age.  Pain/Pressure: Present     Pelvic: Cervical exam deferred        Extremities: Normal range of motion.  Edema: None  Mental Status: Normal mood and affect. Normal behavior. Normal judgment and thought content.   Assessment and Plan:  Pregnancy: G3P1011 at [redacted]w[redacted]d  1. Encounter for supervision of low-risk pregnancy in second trimester -[place] results reviewed -Discussed birth control, patient now wants birth control pills.  - [place] MFM OB COMP + 14 WK; Future  Preterm labor symptoms and general obstetric precautions including but not limited to vaginal bleeding, contractions, leaking of fluid and fetal movement  were reviewed in detail with the patient. Please refer to After Visit Summary for other counseling recommendations.  Return in about 1 month (around 04/25/2018), or LR[facility].  Future Appointments  Date Time Provider Department Center  04/10/2018  1:15 PM [facility] [place] 4 [facility]    Marylene Land, CNM

## 2018-03-28 NOTE — Patient Instructions (Signed)
Second Trimester of Pregnancy The second trimester is from week 13 through week 28, month 4 through 6. This is often the time in pregnancy that you feel your best. Often times, morning sickness has lessened or quit. You may have more energy, and you may get hungry more often. Your unborn baby (fetus) is growing rapidly. At the end of the sixth month, he or she is about 9 inches long and weighs about 1 pounds. You will likely feel the baby move (quickening) between 18 and 20 weeks of pregnancy. Follow these instructions at home:  Avoid all smoking, herbs, and alcohol. Avoid drugs not approved by your doctor.  Do not use any tobacco products, including cigarettes, chewing tobacco, and electronic cigarettes. If you need help quitting, ask your doctor. You may get counseling or other support to help you quit.  Only take medicine as told by your doctor. Some medicines are safe and some are not during pregnancy.  Exercise only as told by your doctor. Stop exercising if you start having cramps.  Eat regular, healthy meals.  Wear a good support bra if your breasts are tender.  Do not use hot tubs, steam rooms, or saunas.  Wear your seat belt when driving.  Avoid raw meat, uncooked cheese, and liter boxes and soil used by cats.  Take your prenatal vitamins.  Take 1500-2000 milligrams of calcium daily starting at the 20th week of pregnancy until you deliver your baby.  Try taking medicine that helps you poop (stool softener) as needed, and if your doctor approves. Eat more fiber by eating fresh fruit, vegetables, and whole grains. Drink enough fluids to keep your pee (urine) clear or pale yellow.  Take warm water baths (sitz baths) to soothe pain or discomfort caused by hemorrhoids. Use hemorrhoid cream if your doctor approves.  If you have puffy, bulging veins (varicose veins), wear support hose. Raise (elevate) your feet for 15 minutes, 3-4 times a day. Limit salt in your diet.  Avoid heavy  lifting, wear low heals, and sit up straight.  Rest with your legs raised if you have leg cramps or low back pain.  Visit your dentist if you have not gone during your pregnancy. Use a soft toothbrush to brush your teeth. Be gentle when you floss.  You can have sex (intercourse) unless your doctor tells you not to.  Go to your doctor visits. Get help if:  You feel dizzy.  You have mild cramps or pressure in your lower belly (abdomen).  You have a nagging pain in your belly area.  You continue to feel sick to your stomach (nauseous), throw up (vomit), or have watery poop (diarrhea).  You have bad smelling fluid coming from your vagina.  You have pain with peeing (urination). Get help right away if:  You have a fever.  You are leaking fluid from your vagina.  You have spotting or bleeding from your vagina.  You have severe belly cramping or pain.  You lose or gain weight rapidly.  You have trouble catching your breath and have chest pain.  You notice sudden or extreme puffiness (swelling) of your face, hands, ankles, feet, or legs.  You have not felt the baby move in over an hour.  You have severe headaches that do not go away with medicine.  You have vision changes. This information is not intended to replace advice given to you by your health care provider. Make sure you discuss any questions you have with your health care   provider. Document Released: 02/06/2010 Document Revised: 04/19/2016 Document Reviewed: 01/13/2013 Elsevier Interactive Patient Education  2017 Elsevier Inc[employer]  

## 2018-04-10 ENCOUNTER — Other Ambulatory Visit: Payer: Self-pay | Admitting: Student

## 2018-04-10 ENCOUNTER — Ambulatory Visit (HOSPITAL_COMMUNITY)
Admission: RE | Admit: 2018-04-10 | Discharge: 2018-04-10 | Disposition: A | Discharge status: Home / Self Care | Payer: Managed Care, Other (non HMO) | Source: Ambulatory Visit | Attending: Student | Admitting: Student

## 2018-04-10 DIAGNOSIS — Z3A18 18 weeks gestation of pregnancy: Secondary | ICD-10-CM

## 2018-04-10 DIAGNOSIS — O09892 Supervision of other high risk pregnancies, second trimester: Secondary | ICD-10-CM | POA: Insufficient documentation

## 2018-04-10 DIAGNOSIS — Z3492 Encounter for supervision of normal pregnancy, unspecified, second trimester: Secondary | ICD-10-CM

## 2018-04-10 DIAGNOSIS — Z3689 Encounter for other specified antenatal screening: Secondary | ICD-10-CM | POA: Diagnosis not present

## 2018-04-10 NOTE — Addendum Note (Signed)
Encounter addended by: Emeline Darling, RT on: 04/10/2018 3:14 PM  Actions taken: Imaging Exam ended

## 2018-04-28 ENCOUNTER — Encounter: Payer: Self-pay | Admitting: General Practice

## 2018-04-28 ENCOUNTER — Encounter: Payer: Self-pay | Admitting: Advanced Practice Midwife

## 2018-04-28 ENCOUNTER — Ambulatory Visit (INDEPENDENT_AMBULATORY_CARE_PROVIDER_SITE_OTHER): Payer: Managed Care, Other (non HMO) | Admitting: Advanced Practice Midwife

## 2018-04-28 VITALS — BP 115/73 | HR 78 | Wt 112.8 lb

## 2018-04-28 DIAGNOSIS — Z3490 Encounter for supervision of normal pregnancy, unspecified, unspecified trimester: Secondary | ICD-10-CM

## 2018-04-28 DIAGNOSIS — Z3482 Encounter for supervision of other normal pregnancy, second trimester: Secondary | ICD-10-CM

## 2018-04-28 NOTE — Progress Notes (Signed)
   PRENATAL VISIT NOTE  Subjective:  Jaclyn Delgado is a 22 y.o. G3P1011 at 4361w3d being seen today for ongoing prenatal care.  She is currently monitored for the following issues for this low-risk pregnancy and has Supervision of low-risk pregnancy; Short interval between pregnancies affecting pregnancy in first trimester, antepartum; Abnormal Papanicolaou smear of cervix; Rubella non-immune status, antepartum; and Episodic lightheadedness on their problem list.  Patient reports no complaints.  Contractions: Not present. Vag. Bleeding: None.  Movement: Present. Denies leaking of fluid.   Patient would like work note for her to be able to sit on a stool while working.   The following portions of the patient's history were reviewed and updated as appropriate: allergies, current medications, past family history, past medical history, past social history, past surgical history and problem list. Problem list updated.  Objective:   Vitals:   04/28/18 1312  BP: 115/73  Pulse: 78  Weight: 112 lb 12.8 oz (51.2 kg)    Fetal Status: Fetal Heart Rate (bpm): 153 Fundal Height: 21 cm Movement: Present     General:  Alert, oriented and cooperative. Patient is in no acute distress.  Skin: Skin is warm and dry. No rash noted.   Cardiovascular: Normal heart rate noted  Respiratory: Normal respiratory effort, no problems with respiration noted  Abdomen: Soft, gravid, appropriate for gestational age.  Pain/Pressure: Absent     Pelvic: Cervical exam deferred        Extremities: Normal range of motion.  Edema: None  Mental Status: Normal mood and affect. Normal behavior. Normal judgment and thought content.   Assessment and Plan:  Pregnancy: G3P1011 at 6561w3d  1. Encounter for supervision of low-risk pregnancy, antepartum - Routine care - General OB work restrictions letter given   Preterm labor symptoms and general obstetric precautions including but not limited to vaginal bleeding, contractions,  leaking of fluid and fetal movement were reviewed in detail with the patient. Please refer to After Visit Summary for other counseling recommendations.  Return in about 1 month (around 05/26/2018).  No future appointments.  Thressa ShellerHeather Hila Bolding, CNM

## 2018-05-26 ENCOUNTER — Encounter: Payer: Self-pay | Admitting: Advanced Practice Midwife

## 2018-05-26 ENCOUNTER — Encounter: Payer: Self-pay | Admitting: General Practice

## 2018-05-26 ENCOUNTER — Ambulatory Visit (INDEPENDENT_AMBULATORY_CARE_PROVIDER_SITE_OTHER): Payer: Managed Care, Other (non HMO) | Admitting: Advanced Practice Midwife

## 2018-05-26 VITALS — BP 122/78 | HR 96 | Wt 115.0 lb

## 2018-05-26 DIAGNOSIS — Z3490 Encounter for supervision of normal pregnancy, unspecified, unspecified trimester: Secondary | ICD-10-CM

## 2018-05-26 DIAGNOSIS — Z3482 Encounter for supervision of other normal pregnancy, second trimester: Secondary | ICD-10-CM

## 2018-05-26 NOTE — Progress Notes (Addendum)
   PRENATAL VISIT NOTE  Subjective:  Jaclyn Delgado is a 22 y.o. G3P1011 at 3851w3d being seen today for ongoing prenatal care.  She is currently monitored for the following issues for this low-risk pregnancy and has Supervision of low-risk pregnancy; Short interval between pregnancies affecting pregnancy in first trimester, antepartum; Abnormal Papanicolaou smear of cervix; Rubella non-immune status, antepartum; and Episodic lightheadedness on their problem list.  Patient reports no complaints.  Contractions: Not present. Vag. Bleeding: None.  Movement: Present. Denies leaking of fluid.   The following portions of the patient's history were reviewed and updated as appropriate: allergies, current medications, past family history, past medical history, past social history, past surgical history and problem list. Problem list updated.  Objective:   Vitals:   05/26/18 1340  BP: 122/78  Pulse: 96  Weight: 115 lb (52.2 kg)    Fetal Status: Fetal Heart Rate (bpm): 149 Fundal Height: 25 cm Movement: Present     General:  Alert, oriented and cooperative. Patient is in no acute distress.  Skin: Skin is warm and dry. No rash noted.   Cardiovascular: Normal heart rate noted  Respiratory: Normal respiratory effort, no problems with respiration noted  Abdomen: Soft, gravid, appropriate for gestational age.  Pain/Pressure: Absent     Pelvic: Cervical exam deferred        Extremities: Normal range of motion.  Edema: None  Mental Status: Normal mood and affect. Normal behavior. Normal judgment and thought content.   Assessment and Plan:  Pregnancy: G3P1011 at 5351w3d  1. Encounter for supervision of low-risk pregnancy, antepartum - 28 week labs at next visit, future orders placed today.  - Glucose Tolerance, 2 Hours w/1 Hour; Future - CBC; Future - HIV antibody (with reflex); Future - RPR; Future  Preterm labor symptoms and general obstetric precautions including but not limited to vaginal  bleeding, contractions, leaking of fluid and fetal movement were reviewed in detail with the patient. Please refer to After Visit Summary for other counseling recommendations.  Return in about 2 weeks (around 06/09/2018).  No future appointments.  Thressa ShellerHeather Eziah Negro, CNM

## 2018-05-26 NOTE — Patient Instructions (Signed)

## 2018-06-17 ENCOUNTER — Ambulatory Visit (INDEPENDENT_AMBULATORY_CARE_PROVIDER_SITE_OTHER): Payer: Self-pay | Admitting: Clinical

## 2018-06-17 ENCOUNTER — Other Ambulatory Visit: Payer: Managed Care, Other (non HMO)

## 2018-06-17 ENCOUNTER — Ambulatory Visit (INDEPENDENT_AMBULATORY_CARE_PROVIDER_SITE_OTHER): Payer: Managed Care, Other (non HMO) | Admitting: Student

## 2018-06-17 VITALS — BP 131/89 | HR 84 | Wt 116.5 lb

## 2018-06-17 DIAGNOSIS — Z658 Other specified problems related to psychosocial circumstances: Secondary | ICD-10-CM

## 2018-06-17 DIAGNOSIS — Z3490 Encounter for supervision of normal pregnancy, unspecified, unspecified trimester: Secondary | ICD-10-CM

## 2018-06-17 DIAGNOSIS — Z3493 Encounter for supervision of normal pregnancy, unspecified, third trimester: Secondary | ICD-10-CM

## 2018-06-17 DIAGNOSIS — Z3483 Encounter for supervision of other normal pregnancy, third trimester: Secondary | ICD-10-CM

## 2018-06-17 DIAGNOSIS — Z23 Encounter for immunization: Secondary | ICD-10-CM | POA: Diagnosis not present

## 2018-06-17 NOTE — BH Specialist Note (Signed)
Integrated Behavioral Health Initial Visit  MRN: [id number] Name: Jaclyn MiniumLadajah R Soler  Number of Integrated Behavioral Health Clinician visits:: 1/6 Session Start time: 11:31  Session End time: 11:40 Total time: 9 minutes  Type of Service: Integrated Behavioral Health- Individual/Family Interpretor:No. Interpretor Name and Language: n/a   Warm Hand Off Completed.       SUBJECTIVE: Jaclyn Delgado is a 22 y.o. female accompanied by n/a Patient was referred by Judeth HornErin Lawrence, NP for psychosocial stress. Patient reports the following symptoms/concerns: Pt states her primary concern today is life stress with current pregnancy, feeling unsupported; that [facility] to help him understand how she is feeling. Duration of problem: Current pregnancy; Severity of problem: Unknown, pt prefers to talk further at next visit  OBJECTIVE: Mood: Normal and Affect: Tearful  Risk of harm to self or others: No plan to harm self or others  LIFE CONTEXT: Family and Social: Pt lives with FOB and 22yo School/Work: - Self-Care: - Life Changes: Current pregnancy  GOALS ADDRESSED: Patient will: 1. Reduce symptoms of: stress 2. Demonstrate ability to: Increase adequate support systems for patient/family  INTERVENTIONS: Interventions utilized: Supportive Counseling  Standardized Assessments completed: GAD-7 and PHQ 9  ASSESSMENT: Patient currently experiencing Psychosocial stress   Patient may benefit from brief therapeutic intervention regarding life stress.  PLAN: 1. Follow up with behavioral health clinician on : Two weeks, or earlier, as needed  2. Referral(s): Integrated Behavioral Health Services (In Clinic) 3. "From scale of 1-10, how likely are you to follow plan?": 10  Rae LipsJamie C Sylver Vantassell, LCSW  Depression screen [facility] 2/[facility] 05/26/2018 04/28/2018 03/28/2018 02/20/2018  Decreased Interest 0 0 0 3  Down, Depressed, Hopeless 0 0 0 1  PHQ - 2 Score  0 0 0 4  Altered sleeping 0 0 0 0  Tired, decreased energy 0 1 1 2   Change in appetite 0 0 1 1  Feeling bad or failure about yourself  0 0 0 0  Trouble concentrating 0 0 0 0  Moving slowly or fidgety/restless 0 0 0 0  Suicidal thoughts 0 0 0 0  PHQ-9 Score 0 1 2 7    GAD 7 : Generalized Anxiety Score 05/26/2018 04/28/2018 03/28/2018 02/20/2018  Nervous, Anxious, on Edge 0 0 0 0  Control/stop worrying 0 0 0 0  Worry too much - different things 0 0 0 0  Trouble relaxing 0 0 0 0  Restless 0 0 0 0  Easily annoyed or irritable 1 1 0 1  Afraid - awful might happen 0 0 0 0  Total GAD 7 Score 1 1 0 1

## 2018-06-17 NOTE — Patient Instructions (Signed)
Research childbirth classes and hospital preregistration at [email or web address]  Fetal Movement Counts Patient Name: ________________________________________________ Patient Due Date: ____________________ What is a fetal movement count? A fetal movement count is the number of times that you feel your baby move during a certain amount of time. This may also be called a fetal kick count. A fetal movement count is recommended for every pregnant woman. You may be asked to start counting fetal movements as early as week 28 of your pregnancy. Pay attention to when your baby is most active. You may notice your baby's sleep and wake cycles. You may also notice things that make your baby move more. You should do a fetal movement count:  When your baby is normally most active.  At the same time each day.  A good time to count movements is while you are resting, after having something to eat and drink. How do I count fetal movements? 1. Find a quiet, comfortable area. Sit, or lie down on your side. 2. Write down the date, the start time and stop time, and the number of movements that you felt between those two times. Take this information with you to your health care visits. 3. For 2 hours, count kicks, flutters, swishes, rolls, and jabs. You should feel at least 10 movements during 2 hours. 4. You may stop counting after you have felt 10 movements. 5. If you do not feel 10 movements in 2 hours, have something to eat and drink. Then, keep resting and counting for 1 hour. If you feel at least 4 movements during that hour, you may stop counting. Contact a health care provider if:  You feel fewer than 4 movements in 2 hours.  Your baby is not moving like he or she usually does. Date: ____________ Start time: ____________ Stop time: ____________ Movements: ____________ Date: ____________ Start time: ____________ Stop time: ____________ Movements: ____________ Date: ____________ Start time: ____________  Stop time: ____________ Movements: ____________ Date: ____________ Start time: ____________ Stop time: ____________ Movements: ____________ Date: ____________ Start time: ____________ Stop time: ____________ Movements: ____________ Date: ____________ Start time: ____________ Stop time: ____________ Movements: ____________ Date: ____________ Start time: ____________ Stop time: ____________ Movements: ____________ Date: ____________ Start time: ____________ Stop time: ____________ Movements: ____________ Date: ____________ Start time: ____________ Stop time: ____________ Movements: ____________ This information is not intended to replace advice given to you by your health care provider. Make sure you discuss any questions you have with your health care provider. Document Released: 12/12/2006 Document Revised: 07/11/2016 Document Reviewed: 12/22/2015 Elsevier Interactive Patient Education  2018 Else[employer]  Braxton Hicks Contractions Contractions of the uterus can occur throughout pregnancy, but they are not always a sign that you are in labor. You may have practice contractions called Braxton Hicks contractions. These false labor contractions are sometimes confused with true labor. What are Braxton Hicks contractions? Braxton Hicks contractions are tightening movements that occur in the muscles of the uterus before labor. Unlike true labor contractions, these contractions do not result in opening (dilation) and thinning of the cervix. Toward the end of pregnancy (32-34 weeks), Braxton Hicks contractions can happen more often and may become stronger. These contractions are sometimes difficult to tell apart from true labor because they can be very uncomfortable. You should not feel embarrassed if you go to the hospital with false labor. Sometimes, the only way to tell if you are in true labor is for your health care provider to look for changes in the cervix. The health care provider will   do a physical  exam and may monitor your contractions. If you are not in true labor, the exam should show that your cervix is not dilating and your water has not broken. If there are other health problems associated with your pregnancy, it is completely safe for you to be sent home with false labor. You may continue to have Braxton Hicks contractions until you go into true labor. How to tell the difference between true labor and false labor True labor  Contractions last 30-70 seconds.  Contractions become very regular.  Discomfort is usually felt in the top of the uterus, and it spreads to the lower abdomen and low back.  Contractions do not go away with walking.  Contractions usually become more intense and increase in frequency.  The cervix dilates and gets thinner. False labor  Contractions are usually shorter and not as strong as true labor contractions.  Contractions are usually irregular.  Contractions are often felt in the front of the lower abdomen and in the groin.  Contractions may go away when you walk around or change positions while lying down.  Contractions get weaker and are shorter-lasting as time goes on.  The cervix usually does not dilate or become thin. Follow these instructions at home:  Take over-the-counter and prescription medicines only as told by your health care provider.  Keep up with your usual exercises and follow other instructions from your health care provider.  Eat and drink lightly if you think you are going into labor.  If Braxton Hicks contractions are making you uncomfortable: ? Change your position from lying down or resting to walking, or change from walking to resting. ? Sit and rest in a tub of warm water. ? Drink enough fluid to keep your urine pale yellow. Dehydration may cause these contractions. ? Do slow and deep breathing several times an hour.  Keep all follow-up prenatal visits as told by your health care provider. This is  important. Contact a health care provider if:  You have a fever.  You have continuous pain in your abdomen. Get help right away if:  Your contractions become stronger, more regular, and closer together.  You have fluid leaking or gushing from your vagina.  You pass blood-tinged mucus (bloody show).  You have bleeding from your vagina.  You have low back pain that you never had before.  You feel your baby's head pushing down and causing pelvic pressure.  Your baby is not moving inside you as much as it used to. Summary  Contractions that occur before labor are called Braxton Hicks contractions, false labor, or practice contractions.  Braxton Hicks contractions are usually shorter, weaker, farther apart, and less regular than true labor contractions. True labor contractions usually become progressively stronger and regular and they become more frequent.  Manage discomfort from Braxton Hicks contractions by changing position, resting in a warm bath, drinking plenty of water, or practicing deep breathing. This information is not intended to replace advice given to you by your health care provider. Make sure you discuss any questions you have with your health care provider. Document Released: 03/28/2017 Document Revised: 03/28/2017 Document Reviewed: 03/28/2017 Elsevier Interactive Patient Education  2018 Elsevier Inc.    

## 2018-06-17 NOTE — Progress Notes (Signed)
   PRENATAL VISIT NOTE  Subjective:  Jaclyn Delgado is a 22 y.o. G3P1011 at 4534w4d being seen today for ongoing prenatal care.  She is currently monitored for the following issues for this low-risk pregnancy and has Supervision of low-risk pregnancy; Short interval between pregnancies affecting pregnancy in first trimester, antepartum; Abnormal Papanicolaou smear of cervix; Rubella non-immune status, antepartum; and Episodic lightheadedness on their problem list.  Patient reports no complaints.  Contractions: Not present. Vag. Bleeding: None.  Movement: Present. Denies leaking of fluid.   The following portions of the patient's history were reviewed and updated as appropriate: allergies, current medications, past family history, past medical history, past social history, past surgical history and problem list. Problem list updated.  Objective:   Vitals:   06/17/18 1128  BP: 131/89  Pulse: 84  Weight: 116 lb 8 oz (52.8 kg)    Fetal Status: Fetal Heart Rate (bpm): 156 Fundal Height: 27 cm Movement: Present     General:  Alert, oriented and cooperative. Patient is in no acute distress.  Skin: Skin is warm and dry. No rash noted.   Cardiovascular: Normal heart rate noted  Respiratory: Normal respiratory effort, no problems with respiration noted  Abdomen: Soft, gravid, appropriate for gestational age.  Pain/Pressure: Absent     Pelvic: Cervical exam deferred        Extremities: Normal range of motion.  Edema: None  Mental Status: Normal mood and affect. Normal behavior. Normal judgment and thought content.   Assessment and Plan:  Pregnancy: G3P1011 at 6334w4d  1. Encounter for supervision of low-risk pregnancy in third trimester -doing well -recently had a friend who lost her baby at 22 wks d/t preeclampsia & pt concerned about her pregnancy. Discussed risk factors & s/s of preeclampsia.   2. Need for Tdap vaccination  - Tdap vaccine greater than or equal to 7yo IM  Preterm labor  symptoms and general obstetric precautions including but not limited to vaginal bleeding, contractions, leaking of fluid and fetal movement were reviewed in detail with the patient. Please refer to After Visit Summary for other counseling recommendations.  Return in about 2 weeks (around 07/01/2018) for Routine OB.  Future Appointments  Date Time Provider Department Center  07/02/2018  1:55 PM Currie ParisBurleson, Terri L, NP [facility]  07/02/2018  2:30 PM [facility]    Judeth HornErin Jaquae Rieves, NP

## 2018-06-19 LAB — CBC
HEMATOCRIT: 32.1 % — AB (ref 34.0–46.6)
HEMOGLOBIN: 10.4 g/dL — AB (ref 11.1–15.9)
MCH: 28.8 pg (ref 26.6–33.0)
MCHC: 32.4 g/dL (ref 31.5–35.7)
MCV: 89 fL (ref 79–97)
Platelets: 291 10*3/uL (ref 150–450)
RBC: 3.61 x10E6/uL — ABNORMAL LOW (ref 3.77–5.28)
RDW: 13.6 % (ref 12.3–15.4)
WBC: 3.7 10*3/uL (ref 3.4–10.8)

## 2018-06-19 LAB — RPR: RPR: NONREACTIVE

## 2018-06-19 LAB — HIV ANTIBODY (ROUTINE TESTING W REFLEX): HIV Screen 4th Generation wRfx: NONREACTIVE

## 2018-06-19 LAB — GLUCOSE TOLERANCE, 2 HOURS W/ 1HR
GLUCOSE, 1 HOUR: 113 mg/dL (ref 65–179)
GLUCOSE, 2 HOUR: 103 mg/dL (ref 65–152)
GLUCOSE, FASTING: 70 mg/dL (ref 65–91)

## 2018-07-02 ENCOUNTER — Ambulatory Visit: Payer: Self-pay

## 2018-07-02 ENCOUNTER — Ambulatory Visit (INDEPENDENT_AMBULATORY_CARE_PROVIDER_SITE_OTHER): Payer: Managed Care, Other (non HMO) | Admitting: Nurse Practitioner

## 2018-07-02 VITALS — BP 111/69 | HR 100 | Wt 117.4 lb

## 2018-07-02 DIAGNOSIS — Z3490 Encounter for supervision of normal pregnancy, unspecified, unspecified trimester: Secondary | ICD-10-CM

## 2018-07-02 DIAGNOSIS — Z3483 Encounter for supervision of other normal pregnancy, third trimester: Secondary | ICD-10-CM

## 2018-07-02 NOTE — Patient Instructions (Signed)
Drink at least 8 8-oz glasses of water every day.   Skin to Skin After delivery, the staff will place your baby on your chest. This helps with the following: . Regulates baby's temperature, breathing, heart rate and blood sugar . Increases Mom's milk supply . Promotes bonding . Keeps baby and Mom calm and decreases baby's crying  Importance of a good latch . Increases milk transfer to baby - baby gets enough milk . Ensures you have enough milk for your baby . Decreases nipple soreness . Don't use pacifiers and bottles - these cause baby to suck differently than breastfeeding . Promotes continuation of breastfeeding

## 2018-07-02 NOTE — Progress Notes (Signed)
    Subjective:  Jaclyn Delgado is a 22 y.o. G3P1011 at 3233w5d being seen today for ongoing prenatal care.  She is currently monitored for the following issues for this low-risk pregnancy and has Supervision of low-risk pregnancy; Short interval between pregnancies affecting pregnancy in first trimester, antepartum; Abnormal Papanicolaou smear of cervix; Rubella non-immune status, antepartum; and Episodic lightheadedness on their problem list.  Patient reports no complaints.  Contractions: Not present. Vag. Bleeding: None.  Movement: Present. Denies leaking of fluid.   The following portions of the patient's history were reviewed and updated as appropriate: allergies, current medications, past family history, past medical history, past social history, past surgical history and problem list. Problem list updated.  Objective:   Vitals:   07/02/18 1439  BP: 111/69  Pulse: 100  Weight: 117 lb 6.4 oz (53.3 kg)    Fetal Status: Fetal Heart Rate (bpm): 150 Fundal Height: 31 cm Movement: Present     General:  Alert, oriented and cooperative. Patient is in no acute distress.  Skin: Skin is warm and dry. No rash noted.   Cardiovascular: Normal heart rate noted  Respiratory: Normal respiratory effort, no problems with respiration noted  Abdomen: Soft, gravid, appropriate for gestational age. Pain/Pressure: Present     Pelvic:  Cervical exam deferred        Extremities: Normal range of motion.  Edema: None  Mental Status: Normal mood and affect. Normal behavior. Normal judgment and thought content.   Urinalysis:      Assessment and Plan:  Pregnancy: G3P1011 at 7333w5d  1. Encounter for supervision of low-risk pregnancy, antepartum Doing well.  Breastfeeding encouraged.  Preterm labor symptoms and general obstetric precautions including but not limited to vaginal bleeding, contractions, leaking of fluid and fetal movement were reviewed in detail with the patient. Please refer to After Visit  Summary for other counseling recommendations.  Return in about 2 weeks (around 07/16/2018).  [name], RN, MSN, NP-BC Nurse Practitioner, [facility], [facility] 07/02/2018 2:59 PM

## 2018-07-18 ENCOUNTER — Ambulatory Visit (INDEPENDENT_AMBULATORY_CARE_PROVIDER_SITE_OTHER): Payer: Managed Care, Other (non HMO) | Admitting: Obstetrics and Gynecology

## 2018-07-18 VITALS — BP 120/81 | HR 90 | Wt 121.8 lb

## 2018-07-18 DIAGNOSIS — O9989 Other specified diseases and conditions complicating pregnancy, childbirth and the puerperium: Secondary | ICD-10-CM

## 2018-07-18 DIAGNOSIS — Z2839 Other underimmunization status: Secondary | ICD-10-CM

## 2018-07-18 DIAGNOSIS — Z283 Underimmunization status: Secondary | ICD-10-CM

## 2018-07-18 DIAGNOSIS — Z3490 Encounter for supervision of normal pregnancy, unspecified, unspecified trimester: Secondary | ICD-10-CM

## 2018-07-18 NOTE — Progress Notes (Signed)
   PRENATAL VISIT NOTE  Subjective:  Jaclyn Delgado is a 22 y.o. G3P1011 at 8476w0d being seen today for ongoing prenatal care.  She is currently monitored for the following issues for this low-risk pregnancy and has Supervision of low-risk pregnancy; Short interval between pregnancies affecting pregnancy in first trimester, antepartum; Abnormal Papanicolaou smear of cervix; Rubella non-immune status, antepartum; and Episodic lightheadedness on their problem list.  Patient reports no complaints.  Contractions: Irritability. Vag. Bleeding: None.  Movement: Present. Denies leaking of fluid.   The following portions of the patient's history were reviewed and updated as appropriate: allergies, current medications, past family history, past medical history, past social history, past surgical history and problem list. Problem list updated.  Objective:   Vitals:   07/18/18 1329  BP: 120/81  Pulse: 90  Weight: 121 lb 12.8 oz (55.2 kg)    Fetal Status: Fetal Heart Rate (bpm): 154 Fundal Height: 33 cm Movement: Present     General:  Alert, oriented and cooperative. Patient is in no acute distress.  Skin: Skin is warm and dry. No rash noted.   Cardiovascular: Normal heart rate noted  Respiratory: Normal respiratory effort, no problems with respiration noted  Abdomen: Soft, gravid, appropriate for gestational age.  Pain/Pressure: Present     Pelvic: Cervical exam deferred        Extremities: Normal range of motion.  Edema: None  Mental Status: Normal mood and affect. Normal behavior. Normal judgment and thought content.   Assessment and Plan:  Pregnancy: G3P1011 at 9576w0d  1. Encounter for supervision of low-risk pregnancy, antepartum  Doing well   2. Rubella non-immune status, antepartum    There are no diagnoses linked to this encounter. Preterm labor symptoms and general obstetric precautions including but not limited to vaginal bleeding, contractions, leaking of fluid and fetal  movement were reviewed in detail with the patient. Please refer to After Visit Summary for other counseling recommendations.  Return in about 1 week (around 07/25/2018).  Future Appointments  Date Time Provider Department Center  07/31/2018 10:15 AM Marylene LandKooistra, Kathryn Lorraine, [facility]  08/14/2018 10:15 AM Crisoforo OxfordKooistra, Charlesetta GaribaldiKathryn Lorraine, CNM [facility]  08/22/2018  1:15 PM Rolm BookbinderNeill, Caroline M, [facility]    Venia CarbonJennifer Rasch, NP

## 2018-07-31 ENCOUNTER — Ambulatory Visit (INDEPENDENT_AMBULATORY_CARE_PROVIDER_SITE_OTHER): Payer: Managed Care, Other (non HMO) | Admitting: Student

## 2018-07-31 DIAGNOSIS — Z3490 Encounter for supervision of normal pregnancy, unspecified, unspecified trimester: Secondary | ICD-10-CM

## 2018-07-31 DIAGNOSIS — Z3483 Encounter for supervision of other normal pregnancy, third trimester: Secondary | ICD-10-CM

## 2018-07-31 NOTE — Patient Instructions (Signed)
Group B Streptococcus Infection During Pregnancy Group B Streptococcus (GBS) is a type of bacteria (Streptococcus agalactiae) that is often found in healthy people, commonly in the rectum, vagina, and intestines. In people who are healthy and not pregnant, the bacteria rarely cause serious illness or complications. However, women who test positive for GBS during pregnancy can pass the bacteria to their baby during childbirth, which can cause serious infection in the baby after birth. Women with GBS may also have infections during their pregnancy or immediately after childbirth, such as such as urinary tract infections (UTIs) or infections of the uterus (uterine infections). Having GBS also increases a woman's risk of complications during pregnancy, such as early (preterm) labor or delivery, miscarriage, or stillbirth. Routine testing (screening) for GBS is recommended for all pregnant women. What increases the risk? You may have a higher risk for GBS infection during pregnancy if you had one during a past pregnancy. What are the signs or symptoms? In most cases, GBS infection does not cause symptoms in pregnant women. Signs and symptoms of a possible GBS-related infection may include:  Labor starting before the 37th week of pregnancy.  A UTI or bladder infection, which may cause: ? Fever. ? Pain or burning during urination. ? Frequent urination.  Fever during labor, along with: ? Bad-smelling discharge. ? Uterine tenderness. ? Rapid heartbeat in the mother, baby, or both.  Rare but serious symptoms of a possible GBS-related infection in women include:  Blood infection (septicemia). This may cause fever, chills, or confusion.  Lung infection (pneumonia). This may cause fever, chills, cough, rapid breathing, difficulty breathing, or chest pain.  Bone, joint, skin, or soft tissue infection.  How is this diagnosed? You may be screened for GBS between week 35 and week 37 of your pregnancy. If  you have symptoms of preterm labor, you may be screened earlier. This condition is diagnosed based on lab test results from:  A swab of fluid from the vagina and rectum.  A urine sample.  How is this treated? This condition is treated with antibiotic medicine. When you go into labor, or as soon as your water breaks (your membranes rupture), you will be given antibiotics through an IV tube. Antibiotics will continue until after you give birth. If you are having a cesarean delivery, you do not need antibiotics unless your membranes have already ruptured. Follow these instructions at home:  Take over-the-counter and prescription medicines only as told by your health care provider.  Take your antibiotic medicine as told by your health care provider. Do not stop taking the antibiotic even if you start to feel better.  Keep all pre-birth (prenatal) visits and follow-up visits as told by your health care provider. This is important. Contact a health care provider if:  You have pain or burning when you urinate.  You have to urinate frequently.  You have a fever or chills.  You develop a bad-smelling vaginal discharge. Get help right away if:  Your membranes rupture.  You go into labor.  You have severe pain in your abdomen.  You have difficulty breathing.  You have chest pain. This information is not intended to replace advice given to you by your health care provider. Make sure you discuss any questions you have with your health care provider. Document Released: 02/19/2008 Document Revised: 06/08/2016 Document Reviewed: 06/07/2016 Elsevier Interactive Patient Education  2018 Elsevier Inc.  

## 2018-07-31 NOTE — Progress Notes (Signed)
   PRENATAL VISIT NOTE  Subjective:  Jaclyn Delgado is a 22 y.o. G3P1011 at [redacted]w[redacted]d being seen today for ongoing prenatal care.  She is currently monitored for the following issues for this low-risk pregnancy and has Supervision of low-risk pregnancy; Short interval between pregnancies affecting pregnancy in first trimester, antepartum; Abnormal Papanicolaou smear of cervix; Rubella non-immune status, antepartum; and Episodic lightheadedness on their problem list.  Patient reports no complaints.  Contractions: Not present. Vag. Bleeding: None.  Movement: Present. Denies leaking of fluid.   The following portions of the patient's history were reviewed and updated as appropriate: allergies, current medications, past family history, past medical history, past social history, past surgical history and problem list. Problem list updated.  Objective:   Vitals:   07/31/18 1058  BP: 121/76  Pulse: 93  Weight: 121 lb 14.4 oz (55.3 kg)    Fetal Status: Fetal Heart Rate (bpm): 154 Fundal Height: 34 cm Movement: Present     General:  Alert, oriented and cooperative. Patient is in no acute distress.  Skin: Skin is warm and dry. No rash noted.   Cardiovascular: Normal heart rate noted  Respiratory: Normal respiratory effort, no problems with respiration noted  Abdomen: Soft, gravid, appropriate for gestational age.  Pain/Pressure: Present     Pelvic: Cervical exam deferred        Extremities: Normal range of motion.  Edema: None  Mental Status: Normal mood and affect. Normal behavior. Normal judgment and thought content.   Assessment and Plan:  Pregnancy: G3P1011 at [redacted]w[redacted]d  1. Encounter for supervision of low-risk pregnancy, antepartum -Doing well; no complaints.  -Ob Box complete  Preterm labor symptoms and general obstetric precautions including but not limited to vaginal bleeding, contractions, leaking of fluid and fetal movement were reviewed in detail with the patient. Please refer to  After Visit Summary for other counseling recommendations.  No follow-ups on file.  Future Appointments  Date Time Provider Department Center  08/14/2018 10:15 AM Marylene Land, CNM [facility]  08/22/2018  1:15 PM Rolm Bookbinder, CNM [facility]    Marylene Land, [place]

## 2018-08-14 ENCOUNTER — Other Ambulatory Visit (HOSPITAL_COMMUNITY)
Admission: RE | Admit: 2018-08-14 | Discharge: 2018-08-14 | Disposition: A | Discharge status: Home / Self Care | Payer: Managed Care, Other (non HMO) | Source: Ambulatory Visit | Attending: Student | Admitting: Student

## 2018-08-14 ENCOUNTER — Ambulatory Visit (INDEPENDENT_AMBULATORY_CARE_PROVIDER_SITE_OTHER): Payer: Managed Care, Other (non HMO) | Admitting: Student

## 2018-08-14 DIAGNOSIS — Z3490 Encounter for supervision of normal pregnancy, unspecified, unspecified trimester: Secondary | ICD-10-CM | POA: Diagnosis present

## 2018-08-14 DIAGNOSIS — Z3493 Encounter for supervision of normal pregnancy, unspecified, third trimester: Secondary | ICD-10-CM | POA: Diagnosis not present

## 2018-08-14 NOTE — Addendum Note (Signed)
Addended by: Henrietta DineNEAL, Vanetta Rule S on: 08/14/2018 11:17 AM   Modules accepted: Orders

## 2018-08-14 NOTE — Patient Instructions (Signed)
Group B Streptococcus Infection During Pregnancy Group B Streptococcus (GBS) is a type of bacteria (Streptococcus agalactiae) that is often found in healthy people, commonly in the rectum, vagina, and intestines. In people who are healthy and not pregnant, the bacteria rarely cause serious illness or complications. However, women who test positive for GBS during pregnancy can pass the bacteria to their baby during childbirth, which can cause serious infection in the baby after birth. Women with GBS may also have infections during their pregnancy or immediately after childbirth, such as such as urinary tract infections (UTIs) or infections of the uterus (uterine infections). Having GBS also increases a woman's risk of complications during pregnancy, such as early (preterm) labor or delivery, miscarriage, or stillbirth. Routine testing (screening) for GBS is recommended for all pregnant women. What increases the risk? You may have a higher risk for GBS infection during pregnancy if you had one during a past pregnancy. What are the signs or symptoms? In most cases, GBS infection does not cause symptoms in pregnant women. Signs and symptoms of a possible GBS-related infection may include:  Labor starting before the 37th week of pregnancy.  A UTI or bladder infection, which may cause: ? Fever. ? Pain or burning during urination. ? Frequent urination.  Fever during labor, along with: ? Bad-smelling discharge. ? Uterine tenderness. ? Rapid heartbeat in the mother, baby, or both.  Rare but serious symptoms of a possible GBS-related infection in women include:  Blood infection (septicemia). This may cause fever, chills, or confusion.  Lung infection (pneumonia). This may cause fever, chills, cough, rapid breathing, difficulty breathing, or chest pain.  Bone, joint, skin, or soft tissue infection.  How is this diagnosed? You may be screened for GBS between week 35 and week 37 of your pregnancy. If  you have symptoms of preterm labor, you may be screened earlier. This condition is diagnosed based on lab test results from:  A swab of fluid from the vagina and rectum.  A urine sample.  How is this treated? This condition is treated with antibiotic medicine. When you go into labor, or as soon as your water breaks (your membranes rupture), you will be given antibiotics through an IV tube. Antibiotics will continue until after you give birth. If you are having a cesarean delivery, you do not need antibiotics unless your membranes have already ruptured. Follow these instructions at home:  Take over-the-counter and prescription medicines only as told by your health care provider.  Take your antibiotic medicine as told by your health care provider. Do not stop taking the antibiotic even if you start to feel better.  Keep all pre-birth (prenatal) visits and follow-up visits as told by your health care provider. This is important. Contact a health care provider if:  You have pain or burning when you urinate.  You have to urinate frequently.  You have a fever or chills.  You develop a bad-smelling vaginal discharge. Get help right away if:  Your membranes rupture.  You go into labor.  You have severe pain in your abdomen.  You have difficulty breathing.  You have chest pain. This information is not intended to replace advice given to you by your health care provider. Make sure you discuss any questions you have with your health care provider. Document Released: 02/19/2008 Document Revised: 06/08/2016 Document Reviewed: 06/07/2016 Elsevier Interactive Patient Education  2018 Elsevier Inc.  

## 2018-08-14 NOTE — Progress Notes (Signed)
   PRENATAL VISIT NOTE  Subjective:  Jaclyn Delgado is a 22 y.o. G3P1011 at 4687w6d being seen today for ongoing prenatal care.  She is currently monitored for the following issues for this low-risk pregnancy and has Supervision of low-risk pregnancy; Short interval between pregnancies affecting pregnancy in first trimester, antepartum; Abnormal Papanicolaou smear of cervix; Rubella non-immune status, antepartum; and Episodic lightheadedness on their problem list.  Patient reports no complaints.  Contractions: Irregular. Vag. Bleeding: None.  Movement: Present. Denies leaking of fluid.   The following portions of the patient's history were reviewed and updated as appropriate: allergies, current medications, past family history, past medical history, past social history, past surgical history and problem list. Problem list updated.  Objective:   Vitals:   08/14/18 1042  BP: 116/75  Pulse: 74  Weight: 121 lb 1.6 oz (54.9 kg)    Fetal Status: Fetal Heart Rate (bpm): 134 Fundal Height: 36 cm Movement: Present  Presentation: Vertex  General:  Alert, oriented and cooperative. Patient is in no acute distress.  Skin: Skin is warm and dry. No rash noted.   Cardiovascular: Normal heart rate noted  Respiratory: Normal respiratory effort, no problems with respiration noted  Abdomen: Soft, gravid, appropriate for gestational age.  Pain/Pressure: Present     Pelvic: Cervical exam deferred Dilation: Closed      Extremities: Normal range of motion.  Edema: None  Mental Status: Normal mood and affect. Normal behavior. Normal judgment and thought content.   Assessment and Plan:  Pregnancy: G3P1011 at 787w6d  1. Encounter for supervision of low-risk pregnancy, antepartum -No questions today; cultures done.   Preterm labor symptoms and general obstetric precautions including but not limited to vaginal bleeding, contractions, leaking of fluid and fetal movement were reviewed in detail with the  patient. Please refer to After Visit Summary for other counseling recommendations.  No follow-ups on file.  Future Appointments  Date Time Provider Department Center  08/22/2018  1:15 PM Rolm BookbinderNeill, Caroline M, CNM [facility]    Charlesetta GaribaldiKathryn Lorraine WilliamsonKooistra, [place]

## 2018-08-15 LAB — GC/CHLAMYDIA PROBE AMP (~~LOC~~) NOT AT ARMC
Chlamydia: NEGATIVE
Neisseria Gonorrhea: NEGATIVE

## 2018-08-18 LAB — CULTURE, BETA STREP (GROUP B ONLY): STREP GP B CULTURE: NEGATIVE

## 2018-08-22 ENCOUNTER — Ambulatory Visit (INDEPENDENT_AMBULATORY_CARE_PROVIDER_SITE_OTHER): Payer: Managed Care, Other (non HMO)

## 2018-08-22 VITALS — BP 118/82 | HR 92 | Wt 123.5 lb

## 2018-08-22 DIAGNOSIS — Z3490 Encounter for supervision of normal pregnancy, unspecified, unspecified trimester: Secondary | ICD-10-CM

## 2018-08-22 NOTE — Patient Instructions (Signed)

## 2018-08-22 NOTE — Progress Notes (Signed)
   PRENATAL VISIT NOTE  Subjective:  Jaclyn Delgado is a 22 y.o. G3P1011 at [redacted]w[redacted]d being seen today for ongoing prenatal care.  She is currently monitored for the following issues for this low-risk pregnancy and has Supervision of low-risk pregnancy; Short interval between pregnancies affecting pregnancy in first trimester, antepartum; Abnormal Papanicolaou smear of cervix; Rubella non-immune status, antepartum; and Episodic lightheadedness on their problem list.  Patient reports no complaints.  Contractions: Irritability. Vag. Bleeding: None.  Movement: Present. Denies leaking of fluid.   The following portions of the patient's history were reviewed and updated as appropriate: allergies, current medications, past family history, past medical history, past social history, past surgical history and problem list. Problem list updated.  Objective:   Vitals:   08/22/18 1341  BP: 118/82  Pulse: 92  Weight: 123 lb 8 oz (56 kg)    Fetal Status: Fetal Heart Rate (bpm): 147 Fundal Height: 38 cm Movement: Present     General:  Alert, oriented and cooperative. Patient is in no acute distress.  Skin: Skin is warm and dry. No rash noted.   Cardiovascular: Normal heart rate noted  Respiratory: Normal respiratory effort, no problems with respiration noted  Abdomen: Soft, gravid, appropriate for gestational age.  Pain/Pressure: Present     Pelvic: Cervical exam deferred        Extremities: Normal range of motion.  Edema: None  Mental Status: Normal mood and affect. Normal behavior. Normal judgment and thought content.   Assessment and Plan:  Pregnancy: G3P1011 at [redacted]w[redacted]d  1. Encounter for supervision of low-risk pregnancy, antepartum - No complaints. Routine care - Methods of IOL reviewed with patient  Term labor symptoms and general obstetric precautions including but not limited to vaginal bleeding, contractions, leaking of fluid and fetal movement were reviewed in detail with the  patient. Please refer to After Visit Summary for other counseling recommendations.  Return in about 1 week (around 08/29/2018) for Return OB visit.  Rolm Bookbinder, CNM 08/22/18 1:50 PM

## 2018-08-28 ENCOUNTER — Ambulatory Visit (INDEPENDENT_AMBULATORY_CARE_PROVIDER_SITE_OTHER): Payer: Managed Care, Other (non HMO) | Admitting: Obstetrics and Gynecology

## 2018-08-28 DIAGNOSIS — Z3490 Encounter for supervision of normal pregnancy, unspecified, unspecified trimester: Secondary | ICD-10-CM

## 2018-08-28 NOTE — Progress Notes (Signed)
   PRENATAL VISIT NOTE  Subjective:  Jaclyn Delgado is a 22 y.o. G3P1011 at [redacted]w[redacted]d being seen today for ongoing prenatal care.  She is currently monitored for the following issues for this low-risk pregnancy and has Supervision of low-risk pregnancy; Short interval between pregnancies affecting pregnancy in first trimester, antepartum; Abnormal Papanicolaou smear of cervix; Rubella non-immune status, antepartum; and Episodic lightheadedness on their problem list.  Patient reports contractions since off and on for a few days .  Contractions: Irregular. Vag. Bleeding: None.  Movement: Present. Denies leaking of fluid.   The following portions of the patient's history were reviewed and updated as appropriate: allergies, current medications, past family history, past medical history, past social history, past surgical history and problem list. Problem list updated.  Objective:   Vitals:   08/28/18 1347  BP: 107/78  Weight: 123 lb 11.2 oz (56.1 kg)    Fetal Status: Fetal Heart Rate (bpm): 132   Movement: Present     General:  Alert, oriented and cooperative. Patient is in no acute distress.  Skin: Skin is warm and dry. No rash noted.   Cardiovascular: Normal heart rate noted  Respiratory: Normal respiratory effort, no problems with respiration noted  Abdomen: Soft, gravid, appropriate for gestational age.  Pain/Pressure: Present     Pelvic: Cervical exam performed        Extremities: Normal range of motion.  Edema: None  Mental Status: Normal mood and affect. Normal behavior. Normal judgment and thought content.   Assessment and Plan:  Pregnancy: G3P1011 at [redacted]w[redacted]d  1. Encounter for supervision of low-risk pregnancy, antepartum  - Doing well - GBS negative  - BPP/NST next visit    Term labor symptoms and general obstetric precautions including but not limited to vaginal bleeding, contractions, leaking of fluid and fetal movement were reviewed in detail with the patient. Please  refer to After Visit Summary for other counseling recommendations.  Return in about 1 week (around 09/04/2018) for NST and BPP next visit if after 40 weeks.  Future Appointments  Date Time Provider Department Center  09/04/2018  2:15 PM [facility] NST [facility]  09/04/2018  3:15 PM Demmi Sindt, Harolyn Rutherford, NP [facility] WOC    Venia Carbon, NP

## 2018-09-04 ENCOUNTER — Ambulatory Visit (INDEPENDENT_AMBULATORY_CARE_PROVIDER_SITE_OTHER): Payer: Managed Care, Other (non HMO) | Admitting: Obstetrics and Gynecology

## 2018-09-04 ENCOUNTER — Other Ambulatory Visit: Payer: Self-pay

## 2018-09-04 ENCOUNTER — Encounter: Payer: Self-pay | Admitting: *Deleted

## 2018-09-04 ENCOUNTER — Encounter: Payer: Self-pay | Admitting: Obstetrics and Gynecology

## 2018-09-04 DIAGNOSIS — Z3490 Encounter for supervision of normal pregnancy, unspecified, unspecified trimester: Secondary | ICD-10-CM

## 2018-09-04 NOTE — Progress Notes (Signed)
   PRENATAL VISIT NOTE  Subjective:  Jaclyn Delgado is a 22 y.o. G3P1011 at [redacted]w[redacted]d being seen today for ongoing prenatal care.  She is currently monitored for the following issues for this Low-risk pregnancy and has Supervision of low-risk pregnancy; Short interval between pregnancies affecting pregnancy in first trimester, antepartum; Abnormal Papanicolaou smear of cervix; Rubella non-immune status, antepartum; and Episodic lightheadedness on their problem list.  Patient reports no complaints.  Contractions: Irregular. Vag. Bleeding: None, Bloody Show Movement: Present. Denies leaking of fluid.   The following portions of the patient's history were reviewed and updated as appropriate: allergies, current medications, past family history, past medical history, past social history, past surgical history and problem list. Problem list updated.  Objective:   Vitals:   09/04/18 1511  BP: 129/69  Pulse: 71  Weight: 126 lb (57.2 kg)    Fetal Status: Fetal Heart Rate (bpm): 144 Fundal Height: 39 cm Movement: Present  Presentation: Vertex  General:  Alert, oriented and cooperative. Patient is in no acute distress.  Skin: Skin is warm and dry. No rash noted.   Cardiovascular: Normal heart rate noted  Respiratory: Normal respiratory effort, no problems with respiration noted  Abdomen: Soft, gravid, appropriate for gestational age.  Pain/Pressure: Present     Pelvic: Cervical exam performed Dilation: 2 Effacement (%): 50 Station: -3  Extremities: Normal range of motion.  Edema: None  Mental Status: Normal mood and affect. Normal behavior. Normal judgment and thought content.   Assessment and Plan:  Pregnancy: G3P1011 at [redacted]w[redacted]d  1. Encounter for supervision of low-risk pregnancy, antepartum  - Membranes stripped today - Induction scheduled  - NST/BPP next Tuesday @ 40 weeks  -Induction scheduled for 10/19 @ 0730 am, she will come back to the office on 10/18 for a foley bulb insertion.  Induction orders placed.   There are no diagnoses linked to this encounter. Term labor symptoms and general obstetric precautions including but not limited to vaginal bleeding, contractions, leaking of fluid and fetal movement were reviewed in detail with the patient. Please refer to After Visit Summary for other counseling recommendations.  Return in about 5 days (around 09/09/2018), or For NST/BPP on 10/15.  Future Appointments  Date Time Provider Department Center  09/09/2018  9:15 AM [facility] NST [facility]  09/09/2018 10:15 AM Jayvion Stefanski, Harolyn Rutherford, NP [facility]  09/13/2018  7:30 AM [facility] ROOM [facility] None    Venia Carbon, NP

## 2018-09-05 ENCOUNTER — Inpatient Hospital Stay (HOSPITAL_COMMUNITY): Payer: Managed Care, Other (non HMO) | Admitting: Anesthesiology

## 2018-09-05 ENCOUNTER — Other Ambulatory Visit: Payer: Self-pay

## 2018-09-05 ENCOUNTER — Encounter (HOSPITAL_COMMUNITY): Payer: Self-pay

## 2018-09-05 ENCOUNTER — Inpatient Hospital Stay (HOSPITAL_COMMUNITY)
Admission: AD | Admit: 2018-09-05 | Discharge: 2018-09-07 | DRG: 807 | Disposition: A | Discharge status: Home / Self Care | Payer: Managed Care, Other (non HMO) | Attending: Obstetrics & Gynecology | Admitting: Obstetrics & Gynecology

## 2018-09-05 DIAGNOSIS — O09891 Supervision of other high risk pregnancies, first trimester: Secondary | ICD-10-CM

## 2018-09-05 DIAGNOSIS — R42 Dizziness and giddiness: Secondary | ICD-10-CM

## 2018-09-05 DIAGNOSIS — Z3490 Encounter for supervision of normal pregnancy, unspecified, unspecified trimester: Secondary | ICD-10-CM

## 2018-09-05 DIAGNOSIS — O9989 Other specified diseases and conditions complicating pregnancy, childbirth and the puerperium: Secondary | ICD-10-CM

## 2018-09-05 DIAGNOSIS — Z2839 Other underimmunization status: Secondary | ICD-10-CM

## 2018-09-05 DIAGNOSIS — Z3A40 40 weeks gestation of pregnancy: Secondary | ICD-10-CM

## 2018-09-05 DIAGNOSIS — Z3483 Encounter for supervision of other normal pregnancy, third trimester: Secondary | ICD-10-CM | POA: Diagnosis present

## 2018-09-05 DIAGNOSIS — Z283 Underimmunization status: Secondary | ICD-10-CM

## 2018-09-05 LAB — CBC
HEMATOCRIT: 30.4 % — AB (ref 36.0–46.0)
Hemoglobin: 9.4 g/dL — ABNORMAL LOW (ref 12.0–15.0)
MCH: 24.7 pg — AB (ref 26.0–34.0)
MCHC: 30.9 g/dL (ref 30.0–36.0)
MCV: 80 fL (ref 80.0–100.0)
Platelets: 257 10*3/uL (ref 150–400)
RBC: 3.8 MIL/uL — ABNORMAL LOW (ref 3.87–5.11)
RDW: 15 % (ref 11.5–15.5)
WBC: 5.7 10*3/uL (ref 4.0–10.5)
nRBC: 0 % (ref 0.0–0.2)

## 2018-09-05 LAB — RPR: RPR Ser Ql: NONREACTIVE

## 2018-09-05 LAB — TYPE AND SCREEN
ABO/RH(D): O POS
Antibody Screen: NEGATIVE

## 2018-09-05 MED ORDER — LIDOCAINE HCL (PF) 1 % IJ SOLN
30.0000 mL | INTRAMUSCULAR | Status: DC | PRN
  Filled 2018-09-05: qty 30

## 2018-09-05 MED ORDER — ZOLPIDEM TARTRATE 5 MG PO TABS: 5.0000 mg | ORAL_TABLET | Freq: Every evening | ORAL | Status: DC | PRN

## 2018-09-05 MED ORDER — FENTANYL 2.5 MCG/ML BUPIVACAINE 1/10 % EPIDURAL INFUSION (WH - ANES)
INTRAMUSCULAR | Status: AC
  Filled 2018-09-05: qty 100

## 2018-09-05 MED ORDER — PHENYLEPHRINE 40 MCG/ML (10ML) SYRINGE FOR IV PUSH (FOR BLOOD PRESSURE SUPPORT)
80.0000 ug | PREFILLED_SYRINGE | INTRAVENOUS | Status: DC | PRN
  Filled 2018-09-05: qty 5

## 2018-09-05 MED ORDER — WITCH HAZEL-GLYCERIN EX PADS: 1.0000 "application " | MEDICATED_PAD | CUTANEOUS | Status: DC | PRN

## 2018-09-05 MED ORDER — OXYCODONE HCL 5 MG/5ML PO SOLN: 10.0000 mg | ORAL | Status: DC | PRN

## 2018-09-05 MED ORDER — PHENYLEPHRINE 40 MCG/ML (10ML) SYRINGE FOR IV PUSH (FOR BLOOD PRESSURE SUPPORT)
PREFILLED_SYRINGE | INTRAVENOUS | Status: AC
  Filled 2018-09-05: qty 10

## 2018-09-05 MED ORDER — OXYTOCIN BOLUS FROM INFUSION
500.0000 mL | Freq: Once | INTRAVENOUS | Status: AC
  Administered 2018-09-05: 500 mL via INTRAVENOUS

## 2018-09-05 MED ORDER — IBUPROFEN 600 MG PO TABS: 600.0000 mg | ORAL_TABLET | Freq: Four times a day (QID) | ORAL | Status: DC

## 2018-09-05 MED ORDER — OXYCODONE-ACETAMINOPHEN 5-325 MG PO TABS: 1.0000 | ORAL_TABLET | ORAL | Status: DC | PRN

## 2018-09-05 MED ORDER — COCONUT OIL OIL: 1.0000 "application " | TOPICAL_OIL | Status: DC | PRN

## 2018-09-05 MED ORDER — DIPHENHYDRAMINE HCL 50 MG/ML IJ SOLN: 12.5000 mg | INTRAMUSCULAR | Status: DC | PRN

## 2018-09-05 MED ORDER — ACETAMINOPHEN 325 MG PO TABS: 650.0000 mg | ORAL_TABLET | ORAL | Status: DC | PRN

## 2018-09-05 MED ORDER — FLEET ENEMA 7-19 GM/118ML RE ENEM: 1.0000 | ENEMA | RECTAL | Status: DC | PRN

## 2018-09-05 MED ORDER — LACTATED RINGERS IV SOLN
500.0000 mL | INTRAVENOUS | Status: DC | PRN
  Administered 2018-09-05: 500 mL via INTRAVENOUS

## 2018-09-05 MED ORDER — ONDANSETRON HCL 4 MG PO TABS: 4.0000 mg | ORAL_TABLET | ORAL | Status: DC | PRN

## 2018-09-05 MED ORDER — FENTANYL 2.5 MCG/ML BUPIVACAINE 1/10 % EPIDURAL INFUSION (WH - ANES)
14.0000 mL/h | INTRAMUSCULAR | Status: DC | PRN
  Administered 2018-09-05: 14 mL/h via EPIDURAL

## 2018-09-05 MED ORDER — SENNOSIDES-DOCUSATE SODIUM 8.6-50 MG PO TABS
2.0000 | ORAL_TABLET | ORAL | Status: DC
  Administered 2018-09-06 (×2): 2 via ORAL
  Filled 2018-09-05 (×2): qty 2

## 2018-09-05 MED ORDER — IBUPROFEN 100 MG/5ML PO SUSP
600.0000 mg | Freq: Four times a day (QID) | ORAL | Status: DC
  Administered 2018-09-05 – 2018-09-07 (×8): 600 mg via ORAL
  Filled 2018-09-05 (×8): qty 30

## 2018-09-05 MED ORDER — ONDANSETRON HCL 4 MG/2ML IJ SOLN: 4.0000 mg | INTRAMUSCULAR | Status: DC | PRN

## 2018-09-05 MED ORDER — DIBUCAINE 1 % RE OINT: 1.0000 "application " | TOPICAL_OINTMENT | RECTAL | Status: DC | PRN

## 2018-09-05 MED ORDER — DIPHENHYDRAMINE HCL 25 MG PO CAPS: 25.0000 mg | ORAL_CAPSULE | Freq: Four times a day (QID) | ORAL | Status: DC | PRN

## 2018-09-05 MED ORDER — LACTATED RINGERS IV SOLN: 500.0000 mL | Freq: Once | INTRAVENOUS | Status: DC

## 2018-09-05 MED ORDER — ACETAMINOPHEN 160 MG/5ML PO SOLN
650.0000 mg | ORAL | Status: DC | PRN
  Administered 2018-09-05: 650 mg via ORAL

## 2018-09-05 MED ORDER — SOD CITRATE-CITRIC ACID 500-334 MG/5ML PO SOLN: 30.0000 mL | ORAL | Status: DC | PRN

## 2018-09-05 MED ORDER — OXYCODONE HCL 5 MG/5ML PO SOLN: 5.0000 mg | ORAL | Status: DC | PRN

## 2018-09-05 MED ORDER — OXYTOCIN 40 UNITS IN LACTATED RINGERS INFUSION - SIMPLE MED
2.5000 [IU]/h | INTRAVENOUS | Status: DC
  Administered 2018-09-05: 2.5 [IU]/h via INTRAVENOUS
  Filled 2018-09-05: qty 1000

## 2018-09-05 MED ORDER — EPHEDRINE 5 MG/ML INJ
10.0000 mg | INTRAVENOUS | Status: DC | PRN
  Filled 2018-09-05: qty 2

## 2018-09-05 MED ORDER — ONDANSETRON HCL 4 MG/2ML IJ SOLN: 4.0000 mg | Freq: Four times a day (QID) | INTRAMUSCULAR | Status: DC | PRN

## 2018-09-05 MED ORDER — SIMETHICONE 80 MG PO CHEW: 80.0000 mg | CHEWABLE_TABLET | ORAL | Status: DC | PRN

## 2018-09-05 MED ORDER — PRENATAL MULTIVITAMIN CH: 1.0000 | ORAL_TABLET | Freq: Every day | ORAL | Status: DC

## 2018-09-05 MED ORDER — LIDOCAINE HCL (PF) 1 % IJ SOLN
INTRAMUSCULAR | Status: DC | PRN
  Administered 2018-09-05 (×2): 4 mL via EPIDURAL

## 2018-09-05 MED ORDER — BENZOCAINE-MENTHOL 20-0.5 % EX AERO: 1.0000 "application " | INHALATION_SPRAY | CUTANEOUS | Status: DC | PRN

## 2018-09-05 MED ORDER — ACETAMINOPHEN 160 MG/5ML PO SOLN: 325.0000 mg | ORAL | Status: DC | PRN

## 2018-09-05 MED ORDER — OXYCODONE-ACETAMINOPHEN 5-325 MG PO TABS: 2.0000 | ORAL_TABLET | ORAL | Status: DC | PRN

## 2018-09-05 MED ORDER — ACETAMINOPHEN 160 MG/5ML PO SOLN
650.0000 mg | Freq: Four times a day (QID) | ORAL | Status: DC | PRN
  Filled 2018-09-05: qty 20.3

## 2018-09-05 MED ORDER — TETANUS-DIPHTH-ACELL PERTUSSIS 5-2.5-18.5 LF-MCG/0.5 IM SUSP: 0.5000 mL | Freq: Once | INTRAMUSCULAR | Status: DC

## 2018-09-05 MED ORDER — LACTATED RINGERS IV SOLN
500.0000 mL | Freq: Once | INTRAVENOUS | Status: AC
  Administered 2018-09-05: 500 mL via INTRAVENOUS

## 2018-09-05 MED ORDER — COMPLETENATE 29-1 MG PO CHEW
1.0000 | CHEWABLE_TABLET | Freq: Every day | ORAL | Status: DC
  Administered 2018-09-06: 1 via ORAL
  Filled 2018-09-05 (×2): qty 1

## 2018-09-05 MED ORDER — LACTATED RINGERS IV SOLN
INTRAVENOUS | Status: DC
  Administered 2018-09-05 (×2): via INTRAVENOUS

## 2018-09-05 NOTE — Anesthesia Procedure Notes (Signed)
Epidural Patient location during procedure: OB Start time: 09/05/2018 6:44 AM End time: 09/05/2018 6:53 AM  Staffing Anesthesiologist: Mal Amabile, MD Performed: anesthesiologist   Preanesthetic Checklist Completed: patient identified, site marked, surgical consent, pre-op evaluation, timeout performed, IV checked, risks and benefits discussed and monitors and equipment checked  Epidural Patient position: sitting Prep: site prepped and draped and DuraPrep Patient monitoring: continuous pulse ox and blood pressure Approach: midline Location: L3-L4 Injection technique: LOR air  Needle:  Needle type: Tuohy  Needle gauge: 17 G Needle length: 9 cm and 9 Needle insertion depth: 4 cm Catheter type: closed end flexible Catheter size: 19 Gauge Catheter at skin depth: 9 cm Test dose: negative and Other  Assessment Events: blood not aspirated, injection not painful, no injection resistance, negative IV test and no paresthesia  Additional Notes Patient identified. Risks and benefits discussed including failed block, incomplete  Pain control, post dural puncture headache, nerve damage, paralysis, blood pressure Changes, nausea, vomiting, reactions to medications-both toxic and allergic and post Partum back pain. All questions were answered. Patient expressed understanding and wished to proceed. Sterile technique was used throughout procedure. Epidural site was Dressed with sterile barrier dressing. No paresthesias, signs of intravascular injection Or signs of intrathecal spread were encountered.  Patient was more comfortable after the epidural was dosed. Please see RN's note for documentation of vital signs and FHR which are stable. Reason for block:procedure for pain

## 2018-09-05 NOTE — Anesthesia Postprocedure Evaluation (Signed)
Anesthesia Post Note  Patient: Jaclyn Delgado  Procedure(s) Performed: AN AD HOC LABOR EPIDURAL     Patient location during evaluation: Mother Baby Anesthesia Type: Epidural Level of consciousness: awake and alert Pain management: pain level controlled Vital Signs Assessment: post-procedure vital signs reviewed and stable Respiratory status: spontaneous breathing, nonlabored ventilation and respiratory function stable Cardiovascular status: stable Postop Assessment: no headache, no backache and epidural receding Anesthetic complications: no    Last Vitals:  Vitals:   09/05/18 1230 09/05/18 1702  BP: 108/74 113/72  Pulse: 78 88  Resp: 18 18  Temp: 36.8 C 37.2 C  SpO2: 100% 100%    Last Pain:  Vitals:   09/05/18 1706  TempSrc:   PainSc: 1    Pain Goal:                 [name]

## 2018-09-05 NOTE — Anesthesia Preprocedure Evaluation (Addendum)
Anesthesia Evaluation  Patient identified by MRN, date of birth, ID band Patient awake    Reviewed: Allergy & Precautions, Patient's Chart, lab work & pertinent test results  Airway Mallampati: II  TM Distance: >3 FB Neck ROM: Full    Dental  (+) Teeth Intact   Pulmonary neg pulmonary ROS,    Pulmonary exam normal breath sounds clear to auscultation- rhonchi       Cardiovascular negative cardio ROS Normal cardiovascular exam Rhythm:Regular Rate:Normal     Neuro/Psych negative neurological ROS  negative psych ROS   GI/Hepatic Neg liver ROS, GERD  Medicated and Controlled,  Endo/Other  negative endocrine ROS  Renal/GU negative Renal ROS  negative genitourinary   Musculoskeletal negative musculoskeletal ROS (+)   Abdominal   Peds  Hematology  (+) anemia ,   Anesthesia Other Findings   Reproductive/Obstetrics (+) Pregnancy                             Anesthesia Physical Anesthesia Plan  ASA: II  Anesthesia Plan: Epidural   Post-op Pain Management:    Induction:   PONV Risk Score and Plan:   Airway Management Planned: Natural Airway  Additional Equipment:   Intra-op Plan:   Post-operative Plan:   Informed Consent: I have reviewed the patients History and Physical, chart, labs and discussed the procedure including the risks, benefits and alternatives for the proposed anesthesia with the patient or authorized representative who has indicated his/her understanding and acceptance.   Dental advisory given  Plan Discussed with: CRNA and Surgeon  Anesthesia Plan Comments:         Anesthesia Quick Evaluation

## 2018-09-05 NOTE — H&P (Signed)
Jaclyn Delgado is a 22 y.o. female G24P1011 with IUP at [redacted]w[redacted]d presenting for contractions. Pt states she has been having regular, every 2-3 minutes contractions, associated with none vaginal bleeding for several hours..  Membranes are intact, with active fetal movement.   [facility]  Prenatal History/Complications:  Term SVD w/o problems  Past Medical History: Past Medical History:  Diagnosis Date  . Medical history non-contributory     Past Surgical History: Past Surgical History:  Procedure Laterality Date  . NO PAST SURGERIES      Obstetrical History: OB History    Gravida  3   Para  1   Term  1   Preterm  0   AB  1   Living  1     SAB  0   TAB  0   Ectopic  0   Multiple  0   Live Births  1            Social History: Social History   Socioeconomic History  . Marital status: Single    Spouse name: Not on file  . Number of children: Not on file  . Years of education: Not on file  . Highest education level: Not on file  Occupational History  . Not on file  Social Needs  . Financial resource strain: Not on file  . Food insecurity:    Worry: Not on file    Inability: Not on file  . Transportation needs:    Medical: Not on file    Non-medical: Not on file  Tobacco Use  . Smoking status: Never Smoker  . Smokeless tobacco: Never Used  Substance and Sexual Activity  . Alcohol use: Not Currently  . Drug use: No  . Sexual activity: Yes    Birth control/protection: None  Lifestyle  . Physical activity:    Days per week: Not on file    Minutes per session: Not on file  . Stress: Not on file  Relationships  . Social connections:    Talks on phone: Not on file    Gets together: Not on file    Attends religious service: Not on file    Active member of club or organization: Not on file    Attends meetings of clubs or organizations: Not on file    Relationship status: Not on file  Other Topics Concern  . Not on file  Social History  Narrative   ** Merged History Encounter **        Family History: Family History  Problem Relation Age of Onset  . Renal Disease Mother   . Bipolar disorder Mother     Allergies: No Known Allergies  Medications Prior to Admission  Medication Sig Dispense Refill Last Dose  . acetaminophen (TYLENOL) 500 MG tablet Take 500 mg by mouth every 6 (six) hours as needed.   Taking  . calcium carbonate (TUMS EX) 750 MG chewable tablet Chew 1 tablet by mouth as needed for heartburn.   Taking  . Prenatal Vit-Fe Fumarate-FA (PRENATAL MULTIVITAMIN) TABS tablet Take 1 tablet by mouth daily at 12 noon.   Taking        Review of Systems   Constitutional: Negative for fever and chills Eyes: Negative for visual disturbances Respiratory: Negative for shortness of breath, dyspnea Cardiovascular: Negative for chest pain or palpitations  Gastrointestinal: Negative for vomiting, diarrhea and constipation.  POSITIVE for abdominal pain (contractions) Genitourinary: Negative for dysuria and urgency Musculoskeletal: Negative for back pain,  joint pain, myalgias  Neurological: Negative for dizziness and headaches      Blood pressure 106/63, pulse 66, temperature 97.8 F (36.6 C), temperature source Oral, resp. rate (!) 22, last menstrual period 11/29/2017, unknown if currently breastfeeding. General appearance: alert, cooperative and moderate distress Lungs: clear to auscultation bilaterally Heart: regular rate and rhythm Abdomen: soft, non-tender; bowel sounds normal Extremities: Homans sign is negative, no sign of DVT DTR's 2+ Presentation: cephalic Fetal monitoring  Baseline: 145 bpm, Variability: Good {> 6 bpm), Accelerations: Reactive and Decelerations: Absent Uterine activity  2-3 minutes, strong Dilation: 7 Effacement (%): 80 Station: -2 Exam by:: Ralene Muskrat, RN   Prenatal labs: ABO, Rh: O/Positive/-- (03/28 1638) Antibody: Negative (03/28 1638) Rubella: <0.90 (03/28 1638) RPR:  Non Reactive (07/23 0916)  HBsAg: Negative (03/28 1638)  HIV: Non Reactive (07/23 0916)  GBS:      Prenatal Transfer Tool  Maternal Diabetes: No Genetic Screening: Normal Maternal Ultrasounds/Referrals: Normal Fetal Ultrasounds or other Referrals:  None Maternal Substance Abuse:  No Significant Maternal Medications:  None Significant Maternal Lab Results: Lab values include: Group B Strep negative    Nursing Staff Provider  Office Location  whog Dating   LMP  Language  english Anatomy US  normal   Flu Vaccine  Declined-08/28/18 Genetic Screen  NIPS:  Low Risk  Normal female   AFP:     TDaP vaccine   06/17/18 Hgb A1C or  GTT Early  Third trimester  Ref. Range 06/17/2018 09:16  Glucose, 1 hour Latest Ref Range: 65 - 179 mg/dL 161  Glucose, Fasting Latest Ref Range: 65 - 91 mg/dL 70  Glucose, 2 hour Latest Ref Range: 65 - 152 mg/dL 096    Rhogam   n/a   LAB RESULTS   Contraception   patch Blood Type O/Positive/-- (03/28 1638)   Circumcision  female Antibody Negative (03/28 1638)  Pediatrician Dr.Rubin Rubella <0.90 (03/28 1638)  Support Person  Dequan RPR Non Reactive (07/23 0916)   Prenatal Classes offered HBsAg Negative (03/28 1638)   BTL Consent  no  HIV Non Reactive (07/23 0916)  VBAC Consent  n/a GBS   Neg    Pap  02/20/18 - NIL    Hgb Electro   normal 2017    CF  neg 2017    SMA  low risk    Waterbirth  [ ]  Class [ ]  Consent [ ]  CNM visit    No results found for this or any previous visit (from the past 24 hour(s)).  Assessment: Jaclyn Delgado is a 22 y.o. [phone] with an IUP at [redacted]w[redacted]d presenting for active labor  Plan: #Labor: expectant management #Pain:  Per request #FWB Cat 1    Jacklyn Shell 09/05/2018, 6:14 AM

## 2018-09-06 ENCOUNTER — Encounter (HOSPITAL_COMMUNITY): Payer: Self-pay | Admitting: *Deleted

## 2018-09-06 LAB — CBC
HCT: 28.1 % — ABNORMAL LOW (ref 36.0–46.0)
HEMOGLOBIN: 8.8 g/dL — AB (ref 12.0–15.0)
MCH: 24.8 pg — AB (ref 26.0–34.0)
MCHC: 31.3 g/dL (ref 30.0–36.0)
MCV: 79.2 fL — ABNORMAL LOW (ref 80.0–100.0)
NRBC: 0 % (ref 0.0–0.2)
PLATELETS: 256 10*3/uL (ref 150–400)
RBC: 3.55 MIL/uL — AB (ref 3.87–5.11)
RDW: 15 % (ref 11.5–15.5)
WBC: 8.8 10*3/uL (ref 4.0–10.5)

## 2018-09-06 MED ORDER — MEASLES, MUMPS & RUBELLA VAC ~~LOC~~ INJ
0.5000 mL | INJECTION | Freq: Once | SUBCUTANEOUS | Status: AC
  Administered 2018-09-07: 0.5 mL via SUBCUTANEOUS
  Filled 2018-09-06 (×2): qty 0.5

## 2018-09-06 NOTE — Progress Notes (Signed)
POSTPARTUM PROGRESS NOTE  Post Partum Day 1  Subjective:  Jaclyn Delgado is a 22 y.o. [id number] s/p SVD at [redacted]w[redacted]d.  She reports she is doing well. No acute events overnight. She denies any problems with ambulating, voiding or po intake. Denies nausea or vomiting.  Pain is well controlled.  Lochia is mild.  Objective: Blood pressure 106/73, pulse 73, temperature 97.9 F (36.6 C), temperature source Oral, resp. rate 18, height 5\' 4"  (1.626 m), weight 55.8 kg, last menstrual period 11/29/2017, SpO2 100 %, unknown if currently breastfeeding.  Physical Exam:  General: alert, cooperative and no distress Chest: no respiratory distress Heart:regular rate, distal pulses intact Abdomen: soft, nontender Uterine Fundus: firm, appropriately tender DVT Evaluation: No calf swelling or tenderness Extremities: no edema Skin: warm, dry  Recent Labs    09/05/18 0610 09/06/18 0537  HGB 9.4* 8.8*  HCT 30.4* 28.1*    Assessment/Plan: Jaclyn Delgado is a 22 y.o. [id number] s/p SVD at [redacted]w[redacted]d   PPD#1- Doing well  Routine postpartum care  Contraception: patches Feeding: Bottle Dispo: Plan for discharge tomorrow.    LOS: 1 day   Leticia Penna D.O. Family Medicine PGY-1 09/06/2018, 8:07 AM

## 2018-09-07 MED ORDER — IBUPROFEN 100 MG/5ML PO SUSP: 600.0000 mg | Freq: Three times a day (TID) | ORAL | 0 refills | Status: AC | PRN

## 2018-09-07 NOTE — Discharge Summary (Signed)
Obstetrics Discharge Summary OB/GYN [facility]   Patient Name: Jaclyn Delgado DOB: [birth year] MRN: [id number]  Date of admission: 09/05/2018 Delivering MD: Patriciaann Clan   Date of discharge: 09/07/2018  Admitting diagnosis: 40WKS CTX Intrauterine pregnancy: [redacted]w[redacted]d    Secondary diagnosis:   Active Problems:   Indication for care in labor or delivery  Additional problems:  . Abnormal Pap smear LSIL . Rubella non-immune . Short interval pregnancy      Discharge diagnosis: Term Pregnancy Delivered                                            Postpartum procedures: MMR ordered to be given prior to discharge Complications: none  Hospital course: Jaclyn LINDERMANis a 22y.o. 434w0dho was admitted for spontaneous onset of labor. Her pregnancy was complicated by the above noted. Her labor course was notable for epidural placement, ROM about an hour prior to delivery. Delivery was complicated by none. Please see delivery/op note for additional details. Her postpartum course was uncomplicated. She was bottle feeding. By day of discharge, she was passing flatus, urinating, eating and drinking without difficulty. Her pain was well-controlled, and she was discharged home with ibuprofen. She will follow-up in clinic in 4-6 weeks.   Physical exam  Vitals:   09/06/18 1424 09/06/18 2157 09/07/18 0300 09/07/18 0617  BP: 112/76 (!) 156/129 114/72 96/70  Pulse: 65 67 75 66  Resp: 16 18 18 18   Temp: 99.2 F (37.3 C) (!) 97.5 F (36.4 C) 97.9 F (36.6 C) 97.7 F (36.5 C)  TempSrc: Oral     SpO2: 100%  100% 96%  Weight:      Height:       General: well-appearing, NAD Lochia: appropriate Uterine Fundus: firm Incision: N/A DVT Evaluation: No significant calf/ankle edema. Labs: Lab Results  Component Value Date   WBC 8.8 09/06/2018   HGB 8.8 (L) 09/06/2018   HCT 28.1 (L) 09/06/2018   MCV 79.2 (L) 09/06/2018   PLT 256 09/06/2018   CMP Latest Ref Rng & Units 01/02/2015   Glucose 70 - 99 mg/dL 97  BUN 6 - 23 mg/dL 8  Creatinine 0.50 - 1.10 mg/dL 0.58  Sodium 135 - 145 mmol/L 137  Potassium 3.5 - 5.1 mmol/L 3.7  Chloride 96 - 112 mmol/L 104  CO2 19 - 32 mmol/L 30  Calcium 8.4 - 10.5 mg/dL 9.1  Total Protein 6.0 - 8.3 g/dL 7.4  Total Bilirubin 0.3 - 1.2 mg/dL 0.6  Alkaline Phos 39 - 117 U/L 57  AST 0 - 37 U/L 22  ALT 0 - 35 U/L 11    Discharge instructions: Per After Visit Summary and "Baby and Me Booklet"  After visit meds:  Allergies as of 09/07/2018   No Known Allergies     Medication List    TAKE these medications   acetaminophen 500 MG tablet Commonly known as:  TYLENOL Take 500 mg by mouth every 6 (six) hours as needed.   calcium carbonate 750 MG chewable tablet Commonly known as:  TUMS EX Chew 1 tablet by mouth as needed for heartburn.   ibuprofen 100 MG/5ML suspension Commonly known as:  ADVIL,MOTRIN Take 30 mLs (600 mg total) by mouth every 8 (eight) hours as needed for up to 3 days.   prenatal multivitamin Tabs tablet Take 1 tablet by mouth daily at  12 noon.       Postpartum contraception: Patch Diet: Routine Diet Activity: Advance as tolerated. Pelvic rest for 6 weeks.   Outpatient follow up:4 weeks - message sent to clinic to schedule  Follow-up Appt: No future appointments. Newborn Data: Live born female  Birth Weight: 6 lb 6.6 oz (2909 g) APGAR: 30, 9  Newborn Delivery   Birth date/time:  09/05/2018 09:18:00 Delivery type:  Vaginal, Spontaneous    Baby Feeding: Bottle Disposition:home with mother  Lambert Mody. Juleen China, DO OB/GYN Fellow, Faculty Practice

## 2018-09-07 NOTE — Progress Notes (Signed)
Nurse was not informed of this blood pressure reading and noticed when completing chart review.  Given patient's trending, blood pressure reading determined to be incorrect; retaken at 0300 and found to be WNL.  Patient has been completely asymptomatic regrading high blood pressures.  Will retake again between 0500-0600.

## 2018-09-09 ENCOUNTER — Encounter: Payer: Self-pay | Admitting: Obstetrics and Gynecology

## 2018-09-09 ENCOUNTER — Other Ambulatory Visit: Payer: Self-pay

## 2018-09-09 ENCOUNTER — Telehealth: Payer: Self-pay | Admitting: *Deleted

## 2018-09-09 NOTE — Telephone Encounter (Signed)
Pt left message stating she would like to know if she can e-mail her FMLA papers to be completed and then faxed to [facility]. Please call back.

## 2018-09-09 NOTE — Telephone Encounter (Signed)
Called pt and advised pt that we would not be able to receive her FMLA paperwork via e-mail.  If she could please come to the office with a copy she will also need to pay $15 and sign ROI for [place] to fill it out.  I also informed pt that it will take 7-10 business days to fill out the paperwork.  Pt stated understanding with no further questions.

## 2018-09-13 ENCOUNTER — Inpatient Hospital Stay (HOSPITAL_COMMUNITY): Admission: RE | Admit: 2018-09-13 | Payer: Managed Care, Other (non HMO) | Source: Ambulatory Visit

## 2018-09-15 ENCOUNTER — Encounter: Payer: Self-pay | Admitting: Family Medicine

## 2018-09-22 ENCOUNTER — Encounter: Payer: Self-pay | Admitting: *Deleted

## 2018-09-23 DIAGNOSIS — Z029 Encounter for administrative examinations, unspecified: Secondary | ICD-10-CM

## 2018-09-29 ENCOUNTER — Telehealth: Payer: Self-pay | Admitting: *Deleted

## 2018-09-29 NOTE — Telephone Encounter (Signed)
Received a voice message this am stating she is calling about her return to work

## 2018-09-30 ENCOUNTER — Telehealth: Payer: Self-pay

## 2018-09-30 NOTE — Telephone Encounter (Signed)
Called pt back to advise her to come to the office to get her letter to return to work, Pt verbalized understanding and had no questions.

## 2018-09-30 NOTE — Telephone Encounter (Signed)
Pt calling requesting information on how she can be released back to work. Pt has FMLA Paperwork in our system.

## 2018-10-02 ENCOUNTER — Encounter: Payer: Self-pay | Admitting: Family Medicine

## 2018-10-06 ENCOUNTER — Encounter (HOSPITAL_COMMUNITY): Payer: Self-pay | Admitting: Emergency Medicine

## 2018-10-06 ENCOUNTER — Ambulatory Visit (HOSPITAL_COMMUNITY)
Admission: EM | Admit: 2018-10-06 | Discharge: 2018-10-06 | Disposition: A | Discharge status: Home / Self Care | Payer: Managed Care, Other (non HMO) | Attending: Family Medicine | Admitting: Family Medicine

## 2018-10-06 DIAGNOSIS — L309 Dermatitis, unspecified: Secondary | ICD-10-CM | POA: Diagnosis not present

## 2018-10-06 MED ORDER — TRIAMCINOLONE ACETONIDE 0.1 % EX CREA: 1.0000 "application " | TOPICAL_CREAM | Freq: Two times a day (BID) | CUTANEOUS | 0 refills | Status: DC

## 2018-10-06 NOTE — ED Provider Notes (Signed)
[facility] CARE CENTER   [id number] 10/06/18 Arrival Time: 1139  ASSESSMENT & PLAN:  1. Eczema, unspecified type     Meds ordered this encounter  Medications  . triamcinolone cream (KENALOG) 0.1 %    Sig: Apply 1 application topically 2 (two) times daily.    Dispense:  453.6 g    Refill:  0   Will follow up with PCP or here if worsening or failing to improve as anticipated. Reviewed expectations re: course of current medical issues. Questions answered. Outlined signs and symptoms indicating need for more acute intervention. Patient verbalized understanding. After Visit Summary given.   SUBJECTIVE:  Jaclyn Delgado is a 22 y.o. female who reports gradual exacerbation of her eczema over the past few weeks. Usually worse when weather gets cold. Usually on extremities. Area over posterior upper R calf bothering her the most. "Intense itching". Usually steroid creams help. She does see a dermatologist. No surrounding erythema or drainage from areas reported. Afebrile. OTC Eucerin without much relief.   Associated pain? none Progression: stable  Drainage? No  Other associated symptoms: none Arthralgia or myalgia? none Recent illness? none Fever? none Hot showers worsen itching.  ROS: As per HPI.  OBJECTIVE: Vitals:   10/06/18 1227  BP: (!) 130/7  Pulse: 73  Resp: 18  Temp: 97.7 F (36.5 C)  TempSrc: Oral  SpO2: 100%    General appearance: alert; no distress Lungs: clear to auscultation bilaterally Heart: regular rate and rhythm Extremities: no edema Skin: warm and dry; signs of infection: no; areas of atopic dermatitis over extremities; worse over posterior L calf; some excoriations; no erythema Psychological: alert and cooperative; normal mood and affect  No Known Allergies  Past Medical History:  Diagnosis Date  . Medical history non-contributory    Social History   Socioeconomic History  . Marital status: Single    Spouse name: Not on file  . Number  of children: Not on file  . Years of education: Not on file  . Highest education level: Not on file  Occupational History  . Not on file  Social Needs  . Financial resource strain: Not on file  . Food insecurity:    Worry: Not on file    Inability: Not on file  . Transportation needs:    Medical: Not on file    Non-medical: Not on file  Tobacco Use  . Smoking status: Never Smoker  . Smokeless tobacco: Never Used  Substance and Sexual Activity  . Alcohol use: Not Currently  . Drug use: No  . Sexual activity: Yes    Birth control/protection: None  Lifestyle  . Physical activity:    Days per week: Not on file    Minutes per session: Not on file  . Stress: Not on file  Relationships  . Social connections:    Talks on phone: Not on file    Gets together: Not on file    Attends religious service: Not on file    Active member of club or organization: Not on file    Attends meetings of clubs or organizations: Not on file    Relationship status: Not on file  . Intimate partner violence:    Fear of current or ex partner: Not on file    Emotionally abused: Not on file    Physically abused: Not on file    Forced sexual activity: Not on file  Other Topics Concern  . Not on file  Social History Narrative   ** Merged  History Encounter **       Family History  Problem Relation Age of Onset  . Renal Disease Mother   . Bipolar disorder Mother    Past Surgical History:  Procedure Laterality Date  . NO PAST SURGERIES       Mardella Layman, MD 10/06/18 1244

## 2018-10-06 NOTE — ED Triage Notes (Signed)
Pt sts rash on leg x 1 week

## 2018-10-16 ENCOUNTER — Encounter: Payer: Self-pay | Admitting: Nurse Practitioner

## 2018-10-16 ENCOUNTER — Ambulatory Visit (INDEPENDENT_AMBULATORY_CARE_PROVIDER_SITE_OTHER): Payer: Managed Care, Other (non HMO) | Admitting: Nurse Practitioner

## 2018-10-16 DIAGNOSIS — Z1389 Encounter for screening for other disorder: Secondary | ICD-10-CM | POA: Diagnosis not present

## 2018-10-16 DIAGNOSIS — Z3202 Encounter for pregnancy test, result negative: Secondary | ICD-10-CM | POA: Diagnosis not present

## 2018-10-16 DIAGNOSIS — Z30011 Encounter for initial prescription of contraceptive pills: Secondary | ICD-10-CM

## 2018-10-16 LAB — POCT PREGNANCY, URINE: Preg Test, Ur: NEGATIVE

## 2018-10-16 MED ORDER — LEVONORGESTREL-ETHINYL ESTRAD 0.15-30 MG-MCG PO TABS: 1.0000 | ORAL_TABLET | Freq: Every day | ORAL | 3 refills | Status: DC

## 2018-10-16 NOTE — Progress Notes (Signed)
Subjective:     Jaclyn Delgado is a 22 y.o. female who presents for a postpartum visit. She is 5 weeks postpartum following a spontaneous vaginal delivery. I have fully reviewed the prenatal and intrapartum course. The delivery was at 40 gestational weeks. Outcome: spontaneous vaginal delivery. Anesthesia: epidural. Postpartum course has been unevenful. Baby's course has been helathy. Baby is feeding by bottle - Similac Advance. Bleeding moderate lochia. Bowel function is normal. Bladder function is normal. Patient is not sexually active. Contraception method is none. Postpartum depression screening: negative.  The following portions of the patient's history were reviewed and updated as appropriate: allergies, current medications, past family history, past medical history, past social history, past surgical history and problem list.  Reproductive life plan discussed.  Reviewed the effectiveness rate of LARCs.  Declines Depo, Nexplanon or IUD.  Still wants to use OCs.  Discussed needing to take the pills daily for best effectiveness.  Advised pills and condoms always for best effectiveness.  Review of Systems Pertinent items noted in HPI and remainder of comprehensive ROS otherwise negative.   Objective:    BP 132/78   Pulse 94   Ht 5\' 4"  (1.626 m)   Wt 112 lb 9.6 oz (51.1 kg)   LMP 10/11/2018   Breastfeeding? No   BMI 19.33 kg/m   General:  alert, cooperative and no distress   Breasts:  not examined  Lungs: clear to auscultation bilaterally  Heart:  regular rate and rhythm, S1, S2 normal, no murmur, click, rub or gallop  Abdomen: not examined   Vulva:  not evaluated  Vagina: not evaluated  Cervix:  not evaluated  Corpus: not examined  Adnexa:  not evaluated  Rectal Exam: Not performed.        Assessment:    Normal postpartum exam. Pap smear not done at today's visit. Last pap was 02-20-18 and was normal.  Needs to be rechecked in one year due to previous LSIL.  Plan:    1.  Contraception: OCP (estrogen/progesterone)  Postpartum depression screen - 0 2. Condoms x 4 weeks as backup. Start pills on Sunday.  Expect periods to be less days when on the pill. 3. Follow up in: 3 months for BP check and pill refill or as needed.     Nolene BernheimERRI BURLESON, RN, MSN, NP-BC Nurse Practitioner, [facility], [facility] 10/16/2018 5:25 PM

## 2018-10-16 NOTE — Patient Instructions (Signed)
Oral Contraception Information Oral contraceptive pills (OCPs) are medicines taken to prevent pregnancy. OCPs work by preventing the ovaries from releasing eggs. The hormones in OCPs also cause the cervical mucus to thicken, preventing the sperm from entering the uterus. The hormones also cause the uterine lining to become thin, not allowing a fertilized egg to attach to the inside of the uterus. OCPs are highly effective when taken exactly as prescribed. However, OCPs do not prevent sexually transmitted diseases (STDs). Safe sex practices, such as using condoms along with the pill, can help prevent STDs. Before taking the pill, you may have a physical exam and Pap test. Your health care provider may order blood tests. The health care provider will make sure you are a good candidate for oral contraception. Discuss with your health care provider the possible side effects of the OCP you may be prescribed. When starting an OCP, it can take 2 to 3 months for the body to adjust to the changes in hormone levels in your body. Types of oral contraception  The combination pill-This pill contains estrogen and progestin (synthetic progesterone) hormones. The combination pill comes in 21-day, 28-day, or 91-day packs. Some types of combination pills are meant to be taken continuously (365-day pills). With 21-day packs, you do not take pills for 7 days after the last pill. With 28-day packs, the pill is taken every day. The last 7 pills are without hormones. Certain types of pills have more than 21 hormone-containing pills. With 91-day packs, the first 84 pills contain both hormones, and the last 7 pills contain no hormones or contain estrogen only.  The minipill-This pill contains the progesterone hormone only. The pill is taken every day continuously. It is very important to take the pill at the same time each day. The minipill comes in packs of 28 pills. All 28 pills contain the hormone. Advantages of oral  contraceptive pills  Decreases premenstrual symptoms.  Treats menstrual period cramps.  Regulates the menstrual cycle.  Decreases a heavy menstrual flow.  May treatacne, depending on the type of pill.  Treats abnormal uterine bleeding.  Treats polycystic ovarian syndrome.  Treats endometriosis.  Can be used as emergency contraception. Things that can make oral contraceptive pills less effective OCPs can be less effective if:  You forget to take the pill at the same time every day.  You have a stomach or intestinal disease that lessens the absorption of the pill.  You take OCPs with other medicines that make OCPs less effective, such as antibiotics, certain HIV medicines, and some seizure medicines.  You take expired OCPs.  You forget to restart the pill on day 7, when using the packs of 21 pills.  Risks associated with oral contraceptive pills Oral contraceptive pills can sometimes cause side effects, such as:  Headache.  Nausea.  Breast tenderness.  Irregular bleeding or spotting.  Combination pills are also associated with a small increased risk of:  Blood clots.  Heart attack.  Stroke.  This information is not intended to replace advice given to you by your health care provider. Make sure you discuss any questions you have with your health care provider. Document Released: 02/02/2003 Document Revised: 04/19/2016 Document Reviewed: 05/03/2013 Elsevier Interactive Patient Education  2018 Elsevier Inc.  

## 2019-01-16 ENCOUNTER — Ambulatory Visit: Payer: Managed Care, Other (non HMO)

## 2019-01-20 ENCOUNTER — Ambulatory Visit (INDEPENDENT_AMBULATORY_CARE_PROVIDER_SITE_OTHER): Payer: Managed Care, Other (non HMO)

## 2019-01-20 VITALS — BP 99/80 | HR 63 | Ht 64.0 in

## 2019-01-20 DIAGNOSIS — Z0130 Encounter for examination of blood pressure without abnormal findings: Secondary | ICD-10-CM

## 2019-01-20 MED ORDER — LEVONORGESTREL-ETHINYL ESTRAD 0.15-30 MG-MCG PO TABS: 1.0000 | ORAL_TABLET | Freq: Every day | ORAL | 11 refills | Status: DC

## 2019-01-20 NOTE — Progress Notes (Addendum)
Pt here today for BP check and refill on BCP's.  Pt reports not taking the original BCP medication however has not been sexually active since visit.  Pt requests to have refill on BCP because she does want to start taking them.  I explained that she will need to take everyday at the same time and if she misses to please use condoms.  Notified Terri B, NP who stated that the pt can have another Rx for BCP with 12 refills.  Notified pt and pt requests to have BCP e-prescribed to [facility] on [address].  Rx e-prescribed as requested and advised to schedule an annual in one year.  Pt stated understanding with no further questions.   I have reviewed the note and agree with the plan of care.  Nolene Bernheim, RN, MSN, NP-BC Nurse Practitioner, [facility] for [employer], [facility] 01/20/2019 2:44 PM

## 2019-01-30 IMAGING — US US MFM FETAL BPP W/O NON-STRESS
1 series · 13 of 13 positions shown · non-contrast
Comparison: none

[Series 1: us mfm fetal bpp w/o non-stress · 13 acquisitions, 13 frames shown]
[im 1/13]
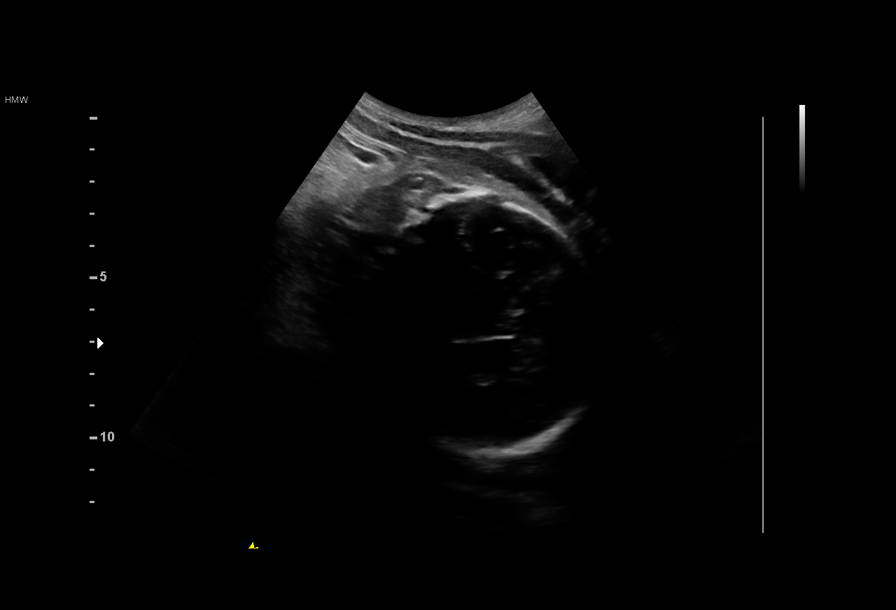
[im 2/13]
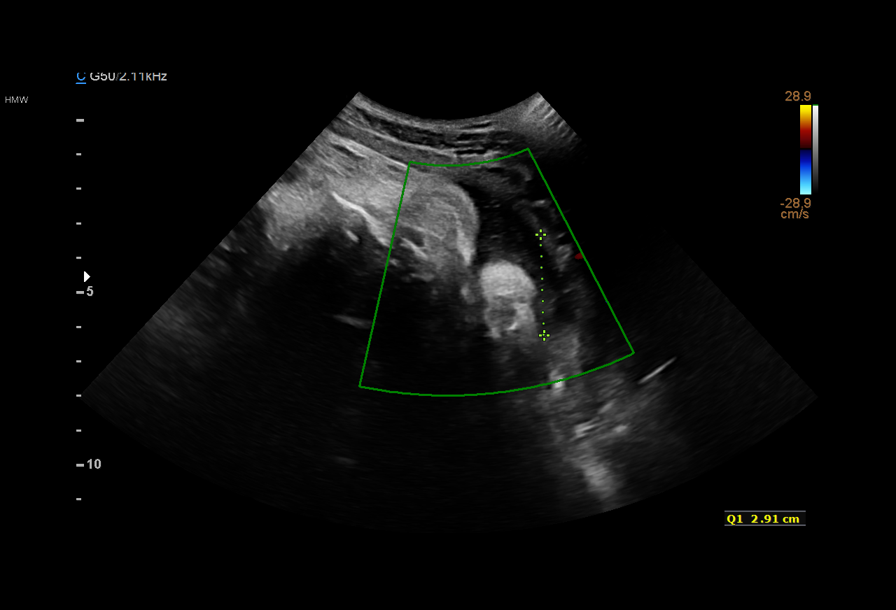
[im 3/13]
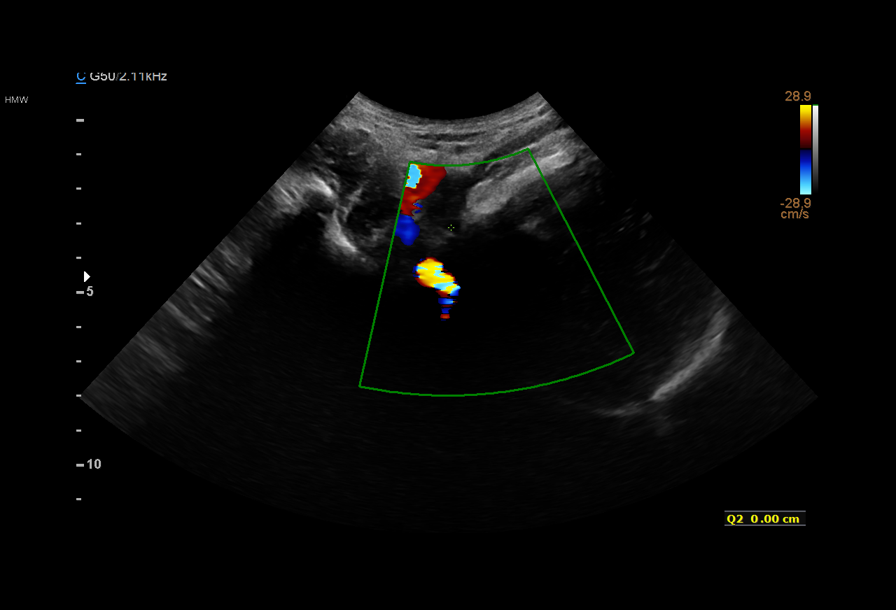
[im 4/13]
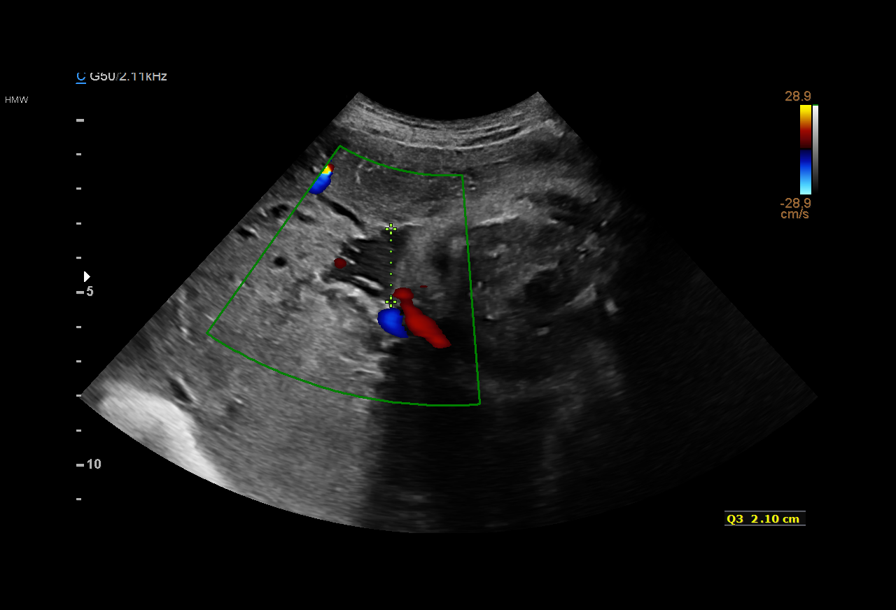
[im 5/13]
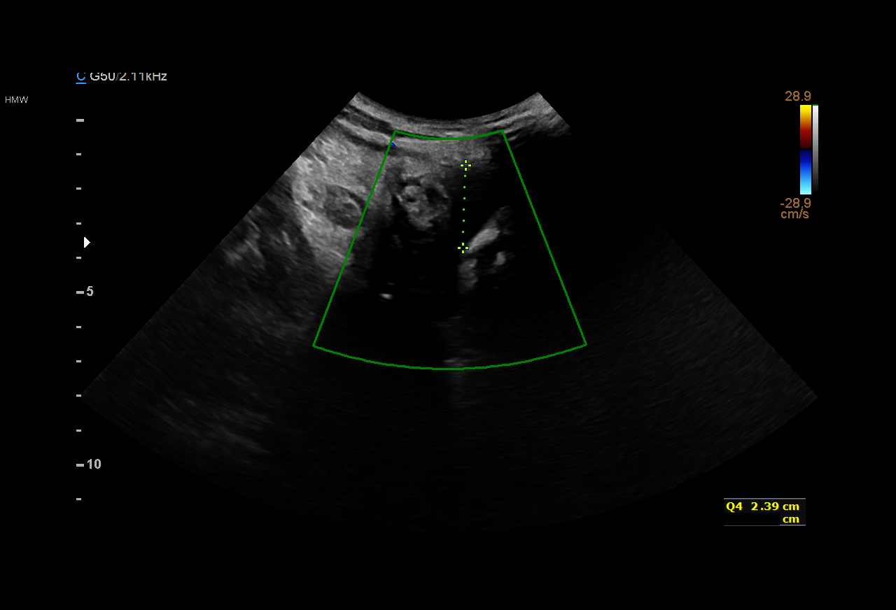
[im 6/13]
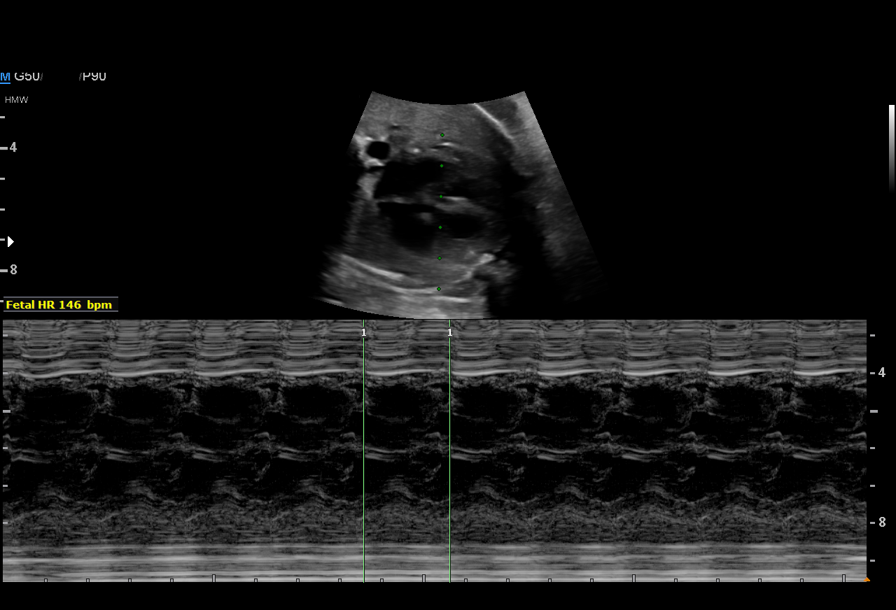
[im 7/13]
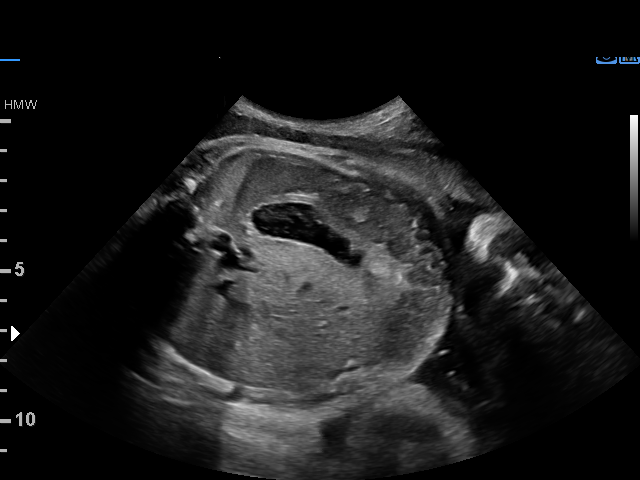
[im 8/13]
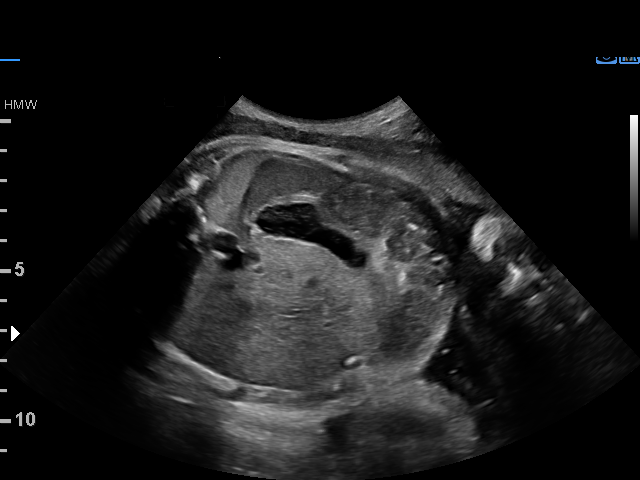
[im 9/13]
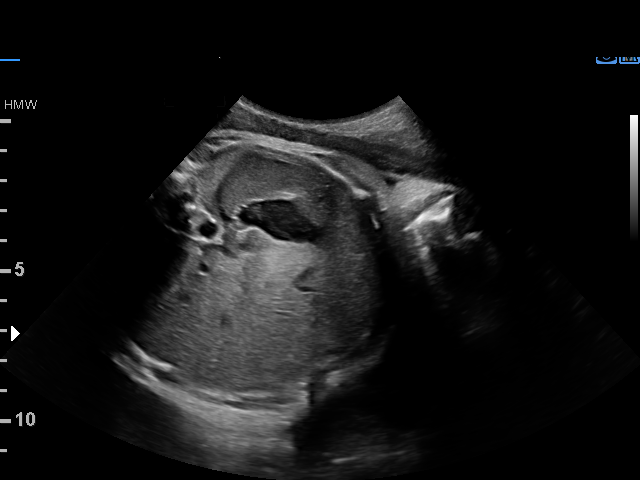
[im 10/13]
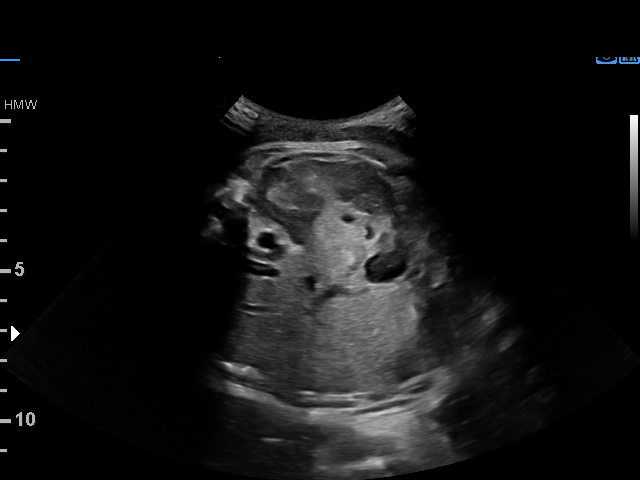
[im 11/13]
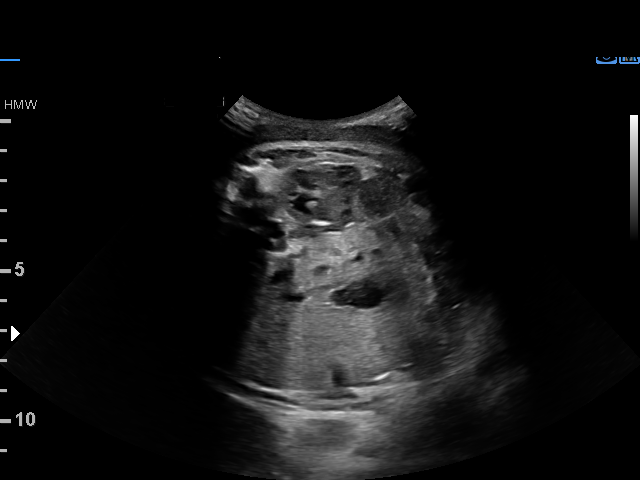
[im 12/13]
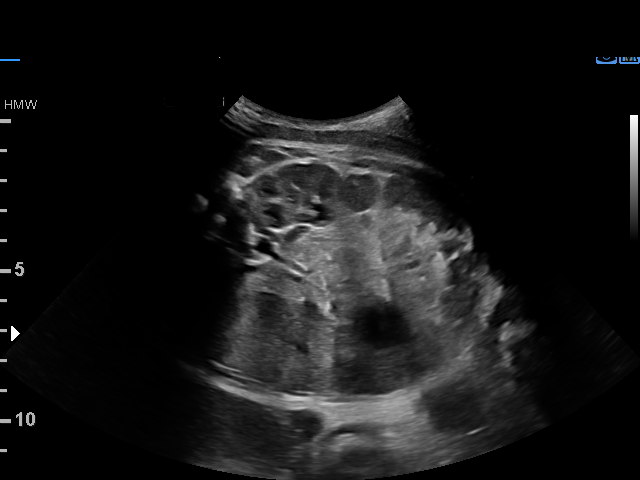
[im 13/13]
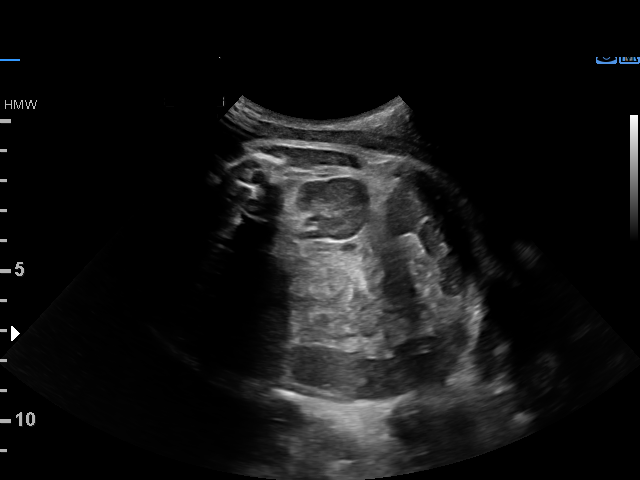

[13 of 13 positions shown; findings below may reference images not displayed]

1  JULLON LIENAD          [id number]      [id number]     [id number]
Indications

38 weeks gestation of pregnancy
Fetal heart rate decelerations affecting       O76
management of mother
OB History

Gravidity:    1         Term:   0        Prem:   0        SAB:   0
TOP:          0       Ectopic:  0        Living: 0
Fetal Evaluation

Num Of Fetuses:     1
Fetal Heart         146
Rate(bpm):
Cardiac Activity:   Observed
Presentation:       Cephalic

Amniotic Fluid
AFI FV:      Subjectively within normal limits

AFI Sum(cm)     %Tile       Largest Pocket(cm)
7.4             6

RUQ(cm)       RLQ(cm)       LUQ(cm)        LLQ(cm)
2.91          2.39          0
Biophysical Evaluation

Amniotic F.V:   Within normal limits       F. Tone:        Observed
F. Movement:    Observed                   Score:          [DATE]
F. Breathing:   Observed
Gestational Age

LMP:           38w 3d        Date:  03/31/16                 EDD:   01/05/17
Best:          38w 3d     Det. By:  LMP  (03/31/16)          EDD:   01/05/17
Impression

Single living intrauterine pregnancy at 98w9d.
Normal amniotic fluid volume.
BPP [DATE].
Recommendations

Follow-up ultrasounds as clinically indicated.

## 2019-11-27 NOTE — L&D Delivery Note (Signed)
OB/GYN Faculty Practice Delivery Note  KATALEAH BEJAR is a 24 y.o. [id number] s/p SVD at [redacted]w[redacted]d. She was admitted for labor.   ROM: 0h 32m with clear fluid GBS Status:  Negative/-- (09/09 1538) Maximum Maternal Temperature: n/a  Labor Progress: . Initial SVE: 3/60/-2. She then progressed to complete.   Delivery Date/Time: 9/15 at 1754  Delivery: Called to room and patient was complete and pushing. Head delivered LOA. No nuchal cord present. Shoulder and body delivered in usual fashion. Infant with spontaneous cry, placed on mother's abdomen, dried and stimulated. Cord clamped x 2 after 1-minute delay, and cut by FOB. Cord blood drawn. Placenta delivered spontaneously with gentle cord traction. Fundus firm with massage and Pitocin. Labia, perineum, vagina, and cervix inspected inspected with no tears.  Baby Weight: pending  Placenta: Sent to L&D Complications: None Lacerations: none EBL: 100 mL Analgesia: none    Infant:  APGAR (1 MIN):  8 APGAR (5 MINS): 9  APGAR (10 MINS):     Casper Harrison, MD [facility] Family Medicine Fellow, [facility] for [facility], [facility] 08/10/2020, 6:11 PM

## 2020-01-04 ENCOUNTER — Other Ambulatory Visit: Payer: Managed Care, Other (non HMO)

## 2020-01-04 ENCOUNTER — Ambulatory Visit: Payer: Managed Care, Other (non HMO) | Attending: Internal Medicine

## 2020-01-04 DIAGNOSIS — Z20822 Contact with and (suspected) exposure to covid-19: Secondary | ICD-10-CM

## 2020-01-05 LAB — NOVEL CORONAVIRUS, NAA: SARS-CoV-2, NAA: DETECTED — AB

## 2020-01-18 ENCOUNTER — Ambulatory Visit: Payer: Managed Care, Other (non HMO) | Attending: Internal Medicine

## 2020-01-18 DIAGNOSIS — Z20822 Contact with and (suspected) exposure to covid-19: Secondary | ICD-10-CM

## 2020-01-19 LAB — NOVEL CORONAVIRUS, NAA: SARS-CoV-2, NAA: NOT DETECTED

## 2020-02-25 ENCOUNTER — Encounter (HOSPITAL_COMMUNITY): Payer: Self-pay | Admitting: Obstetrics & Gynecology

## 2020-02-25 ENCOUNTER — Ambulatory Visit (HOSPITAL_COMMUNITY)
Admission: EM | Admit: 2020-02-25 | Discharge: 2020-02-25 | Disposition: A | Discharge status: Home / Self Care | Payer: Self-pay

## 2020-02-25 ENCOUNTER — Inpatient Hospital Stay (HOSPITAL_COMMUNITY)
Admission: AD | Admit: 2020-02-25 | Discharge: 2020-02-25 | Disposition: A | Discharge status: Other Acute Inpatient Hospital | Payer: Self-pay | Attending: Emergency Medicine | Admitting: Emergency Medicine

## 2020-02-25 ENCOUNTER — Other Ambulatory Visit: Payer: Self-pay

## 2020-02-25 DIAGNOSIS — Z3480 Encounter for supervision of other normal pregnancy, unspecified trimester: Secondary | ICD-10-CM

## 2020-02-25 DIAGNOSIS — Z3A00 Weeks of gestation of pregnancy not specified: Secondary | ICD-10-CM | POA: Insufficient documentation

## 2020-02-25 DIAGNOSIS — Z3482 Encounter for supervision of other normal pregnancy, second trimester: Secondary | ICD-10-CM

## 2020-02-25 DIAGNOSIS — Z3A14 14 weeks gestation of pregnancy: Secondary | ICD-10-CM

## 2020-02-25 DIAGNOSIS — O208 Other hemorrhage in early pregnancy: Secondary | ICD-10-CM | POA: Insufficient documentation

## 2020-02-25 MED ORDER — PRENATE MINI 18-0.6-0.4-350 MG PO CAPS: 1.0000 | ORAL_CAPSULE | Freq: Every day | ORAL | 13 refills | Status: DC

## 2020-02-25 NOTE — ED Provider Notes (Signed)
MSE was initiated and I personally evaluated the patient and placed orders (if any) at  12:53 PM on February 25, 2020.  Patient had her last norma menstruation November 20, 2019.  She took a home pregnancy test sometime in January which was positive, she contracted Covid at the end of January early February and when she was on home isolation she had an episode of scant bleeding passes of tissue that lasted 1 day.  She assumed she miscarried.  She did not start her normal period in March and she took a home pregnancy test that was positive so she wants to know "what happened in February?".  Patient does not report any abdominal pain or vaginal bleeding.  Dicussed with MAU provider Suzette Battiest who accepts transfer to the MAU  The patient appears stable so that the remainder of the MSE may be completed by another provider.     Kaylyn Lim 02/25/20 1303    Sabas Sous, MD 02/29/20 [phone]

## 2020-02-25 NOTE — MAU Provider Note (Signed)
First Provider Initiated Contact with Patient 02/25/20 1332      S Ms. Jaclyn Delgado is a 24 y.o. [phone] pregnant female who presents to MAU today wanting to check to see if she had a miscarriage or not. Patient reports having a positive HPT in January, had one day of vaginal bleeding with tissue in February so thought she may had a miscarriage then never came on her cycle for March.   Patient reports her having COVID in February so she did not come in to be evaluated for vaginal bleeding. She denies vaginal bleeding or abdominal pain since initial vaginal bleeding in February. She just wants to see if she is pregnant or not.   FHR of 148 obtained by doppler    O BP 120/77 (BP Location: Right Arm)   Pulse 89   Temp 98.4 F (36.9 C) (Oral)   Resp 16   Ht 5\' 4"  (1.626 m)   Wt 49.9 kg   LMP 11/18/2019 Comment: one day of bleeding Feb 4  SpO2 100%   BMI 18.88 kg/m  Physical Exam  Constitutional: She is oriented to person, place, and time. She appears well-developed and well-nourished. No distress.  Cardiovascular: Normal rate and regular rhythm.  Respiratory: Effort normal and breath sounds normal. No respiratory distress. She has no wheezes.  GI: Soft. There is no abdominal tenderness. There is no rebound and no guarding.  Neurological: She is alert and oriented to person, place, and time.  Psychiatric: She has a normal mood and affect. Her behavior is normal. Thought content normal.    A 14.1 GA pregnant female by LMP  Medical screening exam complete 1. Supervision of other normal pregnancy, antepartum   2. [redacted] weeks gestation of pregnancy     Educated and discussed vaginal bleeding is common in the first trimester. No pregnancy complaints at this time. No vaginal bleeding or abdominal pain. Patient will call to schedule prenatal appointment- message sent to office for scheduling as well.   Discussed reasons to return to MAU. Return to MAU as needed. Pt stable at time of  discharge.    P Discharge from MAU in stable condition Patient plans on going to [facility] for prenatal care- encouraged to make appointment ASAP  Rx for prenatal vitamins sent to pharmacy of choice  Return to MAU as needed for reasons discussed and/or emergencies Order for outpatient anatomy Feb 6 placed    [place] 02/25/2020 2:36 PM

## 2020-02-25 NOTE — MAU Note (Signed)
Sent up from ER, ? SAB in Feb, lasted one day of small amt of bleeding and pain. No period in March- another +HPT. No bleeding or pain today. Just wanting to find out what is going on.

## 2020-02-25 NOTE — ED Triage Notes (Signed)
Pt here with co of possible miscarriage. Pt had a at home positive pregnancy test in march. Pt thinks she possibly had a miscarriage back in the beginning of Feb but is unsure. Pts  LMP is dec 23. 2020. Denies VB, abd pain, or LOF.

## 2020-03-23 ENCOUNTER — Ambulatory Visit (INDEPENDENT_AMBULATORY_CARE_PROVIDER_SITE_OTHER): Payer: Self-pay | Admitting: *Deleted

## 2020-03-23 ENCOUNTER — Other Ambulatory Visit: Payer: Self-pay

## 2020-03-23 DIAGNOSIS — Z8616 Personal history of COVID-19: Secondary | ICD-10-CM

## 2020-03-23 DIAGNOSIS — Z3490 Encounter for supervision of normal pregnancy, unspecified, unspecified trimester: Secondary | ICD-10-CM | POA: Insufficient documentation

## 2020-03-23 NOTE — Patient Instructions (Signed)

## 2020-03-23 NOTE — Addendum Note (Signed)
Addended by: Gerome Apley on: 03/23/2020 02:42 PM   Modules accepted: Orders

## 2020-03-23 NOTE — Progress Notes (Signed)
I connected with  Jaclyn Delgado on 03/23/20 at  1:30 PM EDT by telephone and verified that I am speaking with the correct person using two identifiers.   I discussed the limitations, risks, security and privacy concerns of performing an evaluation and management service by telephone and the availability of in person appointments. I also discussed with the patient that there may be a patient responsible charge related to this service. The patient expressed understanding and agreed to proceed.  I explained I am completing her New OB Intake today. We discussed Her EDD and that it is based on  sure LMP . I reviewed her allergies, meds, OB History, Medical /Surgical history, and appropriate screenings. I informed her of [facility] services.  I explained I will send her the [employer] app and app was sent to her while on phone- she did not receive the email. Per chart she had [employer] sent with last pregnancy but she states she never set it up. I explained I will send message to [employer] and ask them to resend email. I asked her  to please check for email over the next few days.   I explained we would like her to take her blood pressuer weekly during her pregnancy. She states she will have [facility] effective 03/28/20. I informed her [employer] will not cover BP RX and I asked if she can purchase one.  I asked her to bring the blood pressure cuff with her to her first ob appointment so we can show her how to use it. Explained  then we will have her take her blood pressure weekly and enter into the app. I explained she will have some visits in office and some virtually. She already has MyChart and will download the  app. I reviewed her new ob  appointment date/ time with her , our location( I gave her our new address and phone number)  and to wear mask, no visitors.  I explained she will have a pelvic exam, ob bloodwork, hemoglobin a1C, cbg , and  genetic testing if desired,- she does want a panorama. I scheduled an [place]  for first available for as close to 19 weeks as possible and gave her the appointment. She voices understanding.   Chayson Charters,RN 03/23/2020  1:27 PM

## 2020-03-30 ENCOUNTER — Other Ambulatory Visit (HOSPITAL_COMMUNITY)
Admission: RE | Admit: 2020-03-30 | Discharge: 2020-03-30 | Disposition: A | Discharge status: Home / Self Care | Payer: 59 | Source: Ambulatory Visit | Attending: Advanced Practice Midwife | Admitting: Advanced Practice Midwife

## 2020-03-30 ENCOUNTER — Other Ambulatory Visit: Payer: Self-pay

## 2020-03-30 ENCOUNTER — Ambulatory Visit (INDEPENDENT_AMBULATORY_CARE_PROVIDER_SITE_OTHER): Payer: 59 | Admitting: Advanced Practice Midwife

## 2020-03-30 ENCOUNTER — Encounter: Payer: Self-pay | Admitting: Advanced Practice Midwife

## 2020-03-30 VITALS — BP 114/74 | HR 85 | Wt 124.1 lb

## 2020-03-30 DIAGNOSIS — Z3492 Encounter for supervision of normal pregnancy, unspecified, second trimester: Secondary | ICD-10-CM

## 2020-03-30 DIAGNOSIS — O0932 Supervision of pregnancy with insufficient antenatal care, second trimester: Secondary | ICD-10-CM

## 2020-03-30 DIAGNOSIS — Z3A19 19 weeks gestation of pregnancy: Secondary | ICD-10-CM

## 2020-03-30 NOTE — Patient Instructions (Addendum)
Safe Medications in Pregnancy   Acne: Benzoyl Peroxide Salicylic Acid  Backache/Headache: Tylenol: 2 regular strength every 4 hours OR              2 Extra strength every 6 hours  Colds/Coughs/Allergies: Benadryl (alcohol free) 25 mg every 6 hours as needed Breath right strips Claritin Cepacol throat lozenges Chloraseptic throat spray Cold-Eeze- up to three times per day Cough drops, alcohol free Flonase (by prescription only) Guaifenesin Mucinex Robitussin DM (plain only, alcohol free) Saline nasal spray/drops Sudafed (pseudoephedrine) & Actifed ** use only after [redacted] weeks gestation and if you do not have high blood pressure Tylenol Vicks Vaporub Zinc lozenges Zyrtec   Constipation: Colace Ducolax suppositories Fleet enema Glycerin suppositories Metamucil Milk of magnesia Miralax Senokot Smooth move tea  Diarrhea: Kaopectate Imodium A-D  *NO pepto Bismol  Hemorrhoids: Anusol Anusol HC Preparation H Tucks  Indigestion: Tums Maalox Mylanta Zantac  Pepcid  Insomnia: Benadryl (alcohol free) 25mg every 6 hours as needed Tylenol PM Unisom, no Gelcaps  Leg Cramps: Tums MagGel  Nausea/Vomiting:  Bonine Dramamine Emetrol Ginger extract Sea bands Meclizine  Nausea medication to take during pregnancy:  Unisom (doxylamine succinate 25 mg tablets) Take one tablet daily at bedtime. If symptoms are not adequately controlled, the dose can be increased to a maximum recommended dose of two tablets daily (1/2 tablet in the morning, 1/2 tablet mid-afternoon and one at bedtime). Vitamin B6 100mg tablets. Take one tablet twice a day (up to 200 mg per day).  Skin Rashes: Aveeno products Benadryl cream or 25mg every 6 hours as needed Calamine Lotion 1% cortisone cream  Yeast infection: Gyne-lotrimin 7 Monistat 7   **If taking multiple medications, please check labels to avoid duplicating the same active ingredients **take medication as directed on  the label ** Do not exceed 4000 mg of tylenol in 24 hours **Do not take medications that contain aspirin or ibuprofen     Second Trimester of Pregnancy  The second trimester is from week 14 through week 27 (month 4 through 6). This is often the time in pregnancy that you feel your best. Often times, morning sickness has lessened or quit. You may have more energy, and you may get hungry more often. Your unborn baby is growing rapidly. At the end of the sixth month, he or she is about 9 inches long and weighs about 1 pounds. You will likely feel the baby move between 18 and 20 weeks of pregnancy. Follow these instructions at home: Medicines  Take over-the-counter and prescription medicines only as told by your doctor. Some medicines are safe and some medicines are not safe during pregnancy.  Take a prenatal vitamin that contains at least 600 micrograms (mcg) of folic acid.  If you have trouble pooping (constipation), take medicine that will make your stool soft (stool softener) if your doctor approves. Eating and drinking   Eat regular, healthy meals.  Avoid raw meat and uncooked cheese.  If you get low calcium from the food you eat, talk to your doctor about taking a daily calcium supplement.  Avoid foods that are high in fat and sugars, such as fried and sweet foods.  If you feel sick to your stomach (nauseous) or throw up (vomit): ? Eat 4 or 5 small meals a day instead of 3 large meals. ? Try eating a few soda crackers. ? Drink liquids between meals instead of during meals.  To prevent constipation: ? Eat foods that are high in fiber, like fresh fruits   and vegetables, whole grains, and beans. ? Drink enough fluids to keep your pee (urine) clear or pale yellow. Activity  Exercise only as told by your doctor. Stop exercising if you start to have cramps.  Do not exercise if it is too hot, too humid, or if you are in a place of great height (high altitude).  Avoid heavy  lifting.  Wear low-heeled shoes. Sit and stand up straight.  You can continue to have sex unless your doctor tells you not to. Relieving pain and discomfort  Wear a good support bra if your breasts are tender.  Take warm water baths (sitz baths) to soothe pain or discomfort caused by hemorrhoids. Use hemorrhoid cream if your doctor approves.  Rest with your legs raised if you have leg cramps or low back pain.  If you develop puffy, bulging veins (varicose veins) in your legs: ? Wear support hose or compression stockings as told by your doctor. ? Raise (elevate) your feet for 15 minutes, 3-4 times a day. ? Limit salt in your food. Prenatal care  Write down your questions. Take them to your prenatal visits.  Keep all your prenatal visits as told by your doctor. This is important. Safety  Wear your seat belt when driving.  Make a list of emergency phone numbers, including numbers for family, friends, the hospital, and police and fire departments. General instructions  Ask your doctor about the right foods to eat or for help finding a counselor, if you need these services.  Ask your doctor about local prenatal classes. Begin classes before month 6 of your pregnancy.  Do not use hot tubs, steam rooms, or saunas.  Do not douche or use tampons or scented sanitary pads.  Do not cross your legs for long periods of time.  Visit your dentist if you have not done so. Use a soft toothbrush to brush your teeth. Floss gently.  Avoid all smoking, herbs, and alcohol. Avoid drugs that are not approved by your doctor.  Do not use any products that contain nicotine or tobacco, such as cigarettes and e-cigarettes. If you need help quitting, ask your doctor.  Avoid cat litter boxes and soil used by cats. These carry germs that can cause birth defects in the baby and can cause a loss of your baby (miscarriage) or stillbirth. Contact a doctor if:  You have mild cramps or pressure in your  lower belly.  You have pain when you pee (urinate).  You have bad smelling fluid coming from your vagina.  You continue to feel sick to your stomach (nauseous), throw up (vomit), or have watery poop (diarrhea).  You have a nagging pain in your belly area.  You feel dizzy. Get help right away if:  You have a fever.  You are leaking fluid from your vagina.  You have spotting or bleeding from your vagina.  You have severe belly cramping or pain.  You lose or gain weight rapidly.  You have trouble catching your breath and have chest pain.  You notice sudden or extreme puffiness (swelling) of your face, hands, ankles, feet, or legs.  You have not felt the baby move in over an hour.  You have severe headaches that do not go away when you take medicine.  You have trouble seeing. Summary  The second trimester is from week 14 through week 27 (months 4 through 6). This is often the time in pregnancy that you feel your best.  To take care of yourself and   your unborn baby, you will need to eat healthy meals, take medicines only if your doctor tells you to do so, and do activities that are safe for you and your baby.  Call your doctor if you get sick or if you notice anything unusual about your pregnancy. Also, call your doctor if you need help with the right food to eat, or if you want to know what activities are safe for you. This information is not intended to replace advice given to you by your health care provider. Make sure you discuss any questions you have with your health care provider. Document Revised: 03/06/2019 Document Reviewed: 12/18/2016 Elsevier Patient Education  2020 Elsevier Inc.  

## 2020-03-30 NOTE — Progress Notes (Signed)
History:   Jaclyn Delgado is a 24 y.o. [id number] at [redacted]w[redacted]d by LMP being seen today for her first obstetrical visit. No significant obstetrical history. Patient does intend to breast feed. Pregnancy history fully reviewed.  Patient reports no complaints. She lives with her sons and her partner Barkley Boards. She works as a Training and development officer. She occasionally experiences mild headaches which resolve with rest, hydration and Tylenol.    HISTORY: OB History  Gravida Para Term Preterm AB Living  5 2 2  0 2 2  SAB TAB Ectopic Multiple Live Births  0 0 0 0 2    # Outcome Date GA Lbr Len/2nd Weight Sex Delivery Anes PTL Lv  5 Current           4 AB 06/2019          3 Term 09/05/18 [redacted]w[redacted]d 05:45 / 00:33 6 lb 6.6 oz (2.909 kg) F Vag-Spont EPI  LIV     Birth Comments: wnl     Name: Zadrozny,GIRL [facility]     Apgar1: 9  Apgar5: 9  2 Term 12/26/16 [redacted]w[redacted]d 19:30 / 01:19 6 lb 1.7 oz (2.77 kg) F Vag-Spont EPI  LIV     Birth Comments: wnl     Name: Wiedeman,GIRL [facility]     Apgar1: 8  Apgar5: 9  1 AB 2016            Last pap smear was done 02/20/2018 and was normal.  Past Medical History:  Diagnosis Date  . Medical history non-contributory    Past Surgical History:  Procedure Laterality Date  . NO PAST SURGERIES     Family History  Problem Relation Age of Onset  . Renal Disease Mother   . Bipolar disorder Mother   . Hypertension Mother    Social History   Tobacco Use  . Smoking status: Never Smoker  . Smokeless tobacco: Never Used  Substance Use Topics  . Alcohol use: Not Currently    Comment: socially  . Drug use: No   No Known Allergies Current Outpatient Medications on File Prior to Visit  Medication Sig Dispense Refill  . acetaminophen (TYLENOL) 325 MG tablet Take 650 mg by mouth every 6 (six) hours as needed.    . Prenatal MV & Min w/FA-DHA (PRENATAL ADULT GUMMY/DHA/FA PO) Take 1 tablet by mouth daily.    . Prenat-FeCbn-FeAsp-Meth-FA-DHA (PRENATE MINI) 18-0.6-0.4-350 MG CAPS  Take 1 tablet by mouth daily. (Patient not taking: Reported on 03/23/2020) 30 capsule 13   No current facility-administered medications on file prior to visit.    Review of Systems Pertinent items noted in HPI and remainder of comprehensive ROS otherwise negative. Physical Exam:   Vitals:   03/30/20 1430  BP: 114/74  Pulse: 85  Weight: 124 lb 1.6 oz (56.3 kg)   Fetal Heart Rate (bpm): 148 Uterus:     Pelvic Exam: Perineum: no hemorrhoids, normal perineum   Vulva: normal external genitalia, no lesions  System: General: well-developed, well-nourished female in no acute distress   Breasts:  normal appearance, no masses or tenderness bilaterally   Skin: normal coloration and turgor, no rashes   Neurologic: oriented, normal, negative, normal mood   Extremities: normal strength, tone, and muscle mass, ROM of all joints is normal   HEENT PERRLA, extraocular movement intact and sclera clear, anicteric   Mouth/Teeth mucous membranes moist, pharynx normal without lesions and dental hygiene good   Neck supple and no masses   Cardiovascular: regular rate and rhythm  Respiratory:  no respiratory distress, normal breath sounds   Abdomen: soft, non-tender; bowel sounds normal; no masses,  no organomegaly  Bedside Ultrasound for FHR check: Patient informed that the ultrasound is considered a limited obstetric ultrasound and is not intended to be a complete ultrasound exam.  Patient also informed that the ultrasound is not being completed with the intent of assessing for fetal or placental anomalies or any pelvic abnormalities.  Explained that the purpose of today's ultrasound is to assess for fetal heart rate.  Patient acknowledges the purpose of the exam and the limitations of the study.     Assessment:    Pregnancy: [id number] Patient Active Problem List   Diagnosis Date Noted  . Supervision of low-risk pregnancy 03/23/2020  . History of COVID-19 03/23/2020  . Abnormal Papanicolaou smear of  cervix 02/20/2018     Plan:    1. Encounter for supervision of low-risk pregnancy in second trimester - Routine care, previous low risk pregnancies - Culture, OB Urine - Obstetric Panel, Including HIV - Hemoglobin A1c - Cervicovaginal ancillary only( [place])  2. Late prenatal care affecting pregnancy in second trimester --Patient believed she has miscarried Feb 2021 and was unaware of how far along she was until her MAU visit April 2021. She was unable to secure a New OB appointment before today.   Initial labs drawn. Continue prenatal vitamins. Genetic Screening discussed, First trimester screen, Quad screen and NIPS: declined Ultrasound discussed; fetal anatomic survey: ordered. Problem list reviewed and updated. The nature of [place] with multiple MDs and other Advanced Practice Providers was explained to patient; also emphasized that residents, students are part of our team. Routine obstetric precautions reviewed. Return in about 4 weeks (around 04/27/2020) for Virtual, any provider.    Mallie Snooks, MSN, CNM Certified Nurse Midwife, [employer] for [employer], [place] 03/30/20 8:45 PM

## 2020-03-31 LAB — OBSTETRIC PANEL, INCLUDING HIV
Antibody Screen: NEGATIVE
Basophils Absolute: 0 10*3/uL (ref 0.0–0.2)
Basos: 0 %
EOS (ABSOLUTE): 0 10*3/uL (ref 0.0–0.4)
Eos: 0 %
HIV Screen 4th Generation wRfx: NONREACTIVE
Hematocrit: 29 % — ABNORMAL LOW (ref 34.0–46.6)
Hemoglobin: 9.4 g/dL — ABNORMAL LOW (ref 11.1–15.9)
Hepatitis B Surface Ag: NEGATIVE
Immature Grans (Abs): 0 10*3/uL (ref 0.0–0.1)
Immature Granulocytes: 1 %
Lymphocytes Absolute: 1.4 10*3/uL (ref 0.7–3.1)
Lymphs: 27 %
MCH: 26.3 pg — ABNORMAL LOW (ref 26.6–33.0)
MCHC: 32.4 g/dL (ref 31.5–35.7)
MCV: 81 fL (ref 79–97)
Monocytes Absolute: 0.4 10*3/uL (ref 0.1–0.9)
Monocytes: 8 %
Neutrophils Absolute: 3.2 10*3/uL (ref 1.4–7.0)
Neutrophils: 64 %
Platelets: 301 10*3/uL (ref 150–450)
RBC: 3.58 x10E6/uL — ABNORMAL LOW (ref 3.77–5.28)
RDW: 13.9 % (ref 11.7–15.4)
RPR Ser Ql: NONREACTIVE
Rh Factor: POSITIVE
Rubella Antibodies, IGG: 5.43 index (ref >0.99)
WBC: 5.1 10*3/uL (ref 3.4–10.8)

## 2020-03-31 LAB — HEMOGLOBIN A1C
Est. average glucose Bld gHb Est-mCnc: 105 mg/dL
Hgb A1c MFr Bld: 5.3 % (ref 4.8–5.6)

## 2020-03-31 LAB — URINE CULTURE, OB REFLEX

## 2020-03-31 LAB — CULTURE, OB URINE

## 2020-04-01 LAB — CERVICOVAGINAL ANCILLARY ONLY
Bacterial Vaginitis (gardnerella): POSITIVE — AB
Candida Glabrata: NEGATIVE
Candida Vaginitis: NEGATIVE
Chlamydia: POSITIVE — AB
Comment: NEGATIVE
Comment: NEGATIVE
Comment: NEGATIVE
Comment: NEGATIVE
Comment: NEGATIVE
Comment: NORMAL
Neisseria Gonorrhea: NEGATIVE
Trichomonas: NEGATIVE

## 2020-04-02 ENCOUNTER — Other Ambulatory Visit: Payer: Self-pay | Admitting: Advanced Practice Midwife

## 2020-04-02 DIAGNOSIS — O98819 Other maternal infectious and parasitic diseases complicating pregnancy, unspecified trimester: Secondary | ICD-10-CM | POA: Insufficient documentation

## 2020-04-02 DIAGNOSIS — A749 Chlamydial infection, unspecified: Secondary | ICD-10-CM

## 2020-04-02 MED ORDER — AZITHROMYCIN 500 MG PO TABS: 1000.0000 mg | ORAL_TABLET | Freq: Once | ORAL | 1 refills | Status: AC

## 2020-04-02 NOTE — Progress Notes (Signed)
+   Chlamydia at New OB appt. Notified via active MyChart account.  Clayton Bibles, MSN, CNM Certified Nurse Midwife, [facility] for [employer], [facility] 04/02/20 8:32 PM

## 2020-04-12 ENCOUNTER — Other Ambulatory Visit: Payer: Self-pay

## 2020-04-12 ENCOUNTER — Ambulatory Visit (HOSPITAL_COMMUNITY): Payer: 59 | Attending: Obstetrics and Gynecology

## 2020-04-12 DIAGNOSIS — Z3480 Encounter for supervision of other normal pregnancy, unspecified trimester: Secondary | ICD-10-CM | POA: Diagnosis present

## 2020-04-12 DIAGNOSIS — Z363 Encounter for antenatal screening for malformations: Secondary | ICD-10-CM

## 2020-04-12 DIAGNOSIS — Z3A20 20 weeks gestation of pregnancy: Secondary | ICD-10-CM

## 2020-04-13 ENCOUNTER — Other Ambulatory Visit: Payer: Self-pay | Admitting: *Deleted

## 2020-04-13 DIAGNOSIS — Z362 Encounter for other antenatal screening follow-up: Secondary | ICD-10-CM

## 2020-04-27 ENCOUNTER — Encounter: Payer: Self-pay | Admitting: Obstetrics & Gynecology

## 2020-04-27 ENCOUNTER — Other Ambulatory Visit: Payer: Self-pay

## 2020-04-27 ENCOUNTER — Telehealth (INDEPENDENT_AMBULATORY_CARE_PROVIDER_SITE_OTHER): Payer: 59 | Admitting: Obstetrics & Gynecology

## 2020-04-27 DIAGNOSIS — Z3A23 23 weeks gestation of pregnancy: Secondary | ICD-10-CM

## 2020-04-27 DIAGNOSIS — Z8616 Personal history of COVID-19: Secondary | ICD-10-CM

## 2020-04-27 DIAGNOSIS — A568 Sexually transmitted chlamydial infection of other sites: Secondary | ICD-10-CM

## 2020-04-27 DIAGNOSIS — Z3492 Encounter for supervision of normal pregnancy, unspecified, second trimester: Secondary | ICD-10-CM

## 2020-04-27 DIAGNOSIS — O98812 Other maternal infectious and parasitic diseases complicating pregnancy, second trimester: Secondary | ICD-10-CM

## 2020-04-27 NOTE — Patient Instructions (Signed)

## 2020-04-27 NOTE — Progress Notes (Signed)
I connected with  Jaclyn Delgado on 04/27/20 at  1:55 PM EDT by telephone and verified that I am speaking with the correct person using two identifiers.   I discussed the limitations, risks, security and privacy concerns of performing an evaluation and management service by telephone and the availability of in person appointments. I also discussed with the patient that there may be a patient responsible charge related to this service. The patient expressed understanding and agreed to proceed.  [name], CMA 04/27/2020  1:47 PM

## 2020-04-27 NOTE — Progress Notes (Signed)
    TELEHEALTH OBSTETRICS VISIT ENCOUNTER NOTE  I connected with [name] on 04/27/20 at  1:55 PM EDT by telephone at home and verified that I am speaking with the correct person using two identifiers.   I discussed the limitations, risks, security and privacy concerns of performing an evaluation and management service by telephone and the availability of in person appointments. I also discussed with the patient that there may be a patient responsible charge related to this service. The patient expressed understanding and agreed to proceed.  Subjective:  Jaclyn Delgado is a 24 y.o. [phone] at [redacted]w[redacted]d being followed for ongoing prenatal care.  She is currently monitored for the following issues for this low-risk pregnancy and has Abnormal Papanicolaou smear of cervix; Supervision of low-risk pregnancy; History of COVID-19; and Chlamydia infection affecting pregnancy on their problem list.  Patient reports no complaints. Reports fetal movement. Denies any contractions, bleeding or leaking of fluid.   The following portions of the patient's history were reviewed and updated as appropriate: allergies, current medications, past family history, past medical history, past social history, past surgical history and problem list.   Objective:   General:  Alert, oriented and cooperative.   Mental Status: Normal mood and affect perceived. Normal judgment and thought content.  Rest of physical exam deferred due to type of encounter  Assessment and Plan:  Pregnancy: [id number] at [redacted]w[redacted]d 1. Chlamydia infection affecting pregnancy in second trimester S/p Abx, will need TOC early June  2. Encounter for supervision of low-risk pregnancy in second trimester Discussed routine second trimester goals, weight gain, PTL precautions given.   3. History of COVID-19 Discussed vaccination, pt considering.   Preterm labor symptoms and general obstetric precautions including but not limited to vaginal  bleeding, contractions, leaking of fluid and fetal movement were reviewed in detail with the patient.  I discussed the assessment and treatment plan with the patient. The patient was provided an opportunity to ask questions and all were answered. The patient agreed with the plan and demonstrated an understanding of the instructions. The patient was advised to call back or seek an in-person office evaluation/go to [facility]U at [facility] for any urgent or concerning symptoms. Please refer to After Visit Summary for other counseling recommendations.   I provided 12 minutes of non-face-to-face time during this encounter.  No follow-ups on file.  Future Appointments  Date Time Provider Department Center  05/10/2020  1:45 PM [facility] US4 [facility]    Malachy Chamber, MD Center for [facility], [facility]

## 2020-05-03 ENCOUNTER — Encounter: Payer: Self-pay | Admitting: Family Medicine

## 2020-05-10 ENCOUNTER — Ambulatory Visit: Payer: No Typology Code available for payment source | Attending: Maternal & Fetal Medicine

## 2020-05-10 ENCOUNTER — Other Ambulatory Visit: Payer: Self-pay

## 2020-05-10 DIAGNOSIS — Z3A24 24 weeks gestation of pregnancy: Secondary | ICD-10-CM | POA: Diagnosis not present

## 2020-05-10 DIAGNOSIS — O321XX0 Maternal care for breech presentation, not applicable or unspecified: Secondary | ICD-10-CM

## 2020-05-10 DIAGNOSIS — Z362 Encounter for other antenatal screening follow-up: Secondary | ICD-10-CM | POA: Insufficient documentation

## 2020-05-15 IMAGING — US US MFM OB COMP +14 WKS
1 series · 14 of 28 positions shown · non-contrast
Comparison: none

[Series 1: us mfm ob comp +14 wks · 95 acquisitions, 14 frames shown]
[im 4/95]
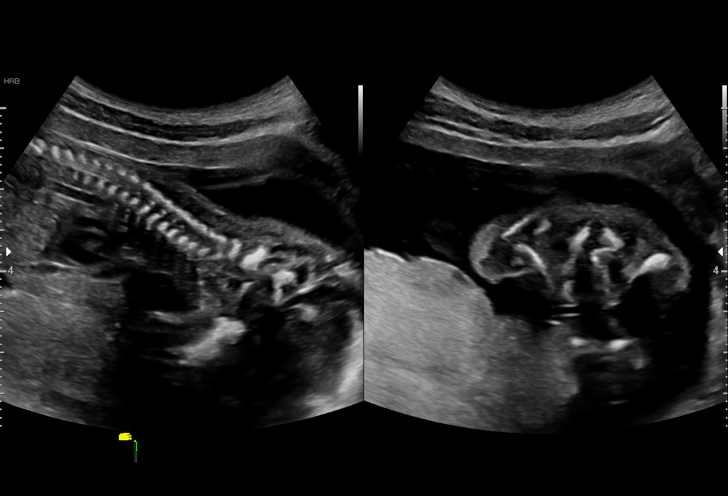
[im 11/95]
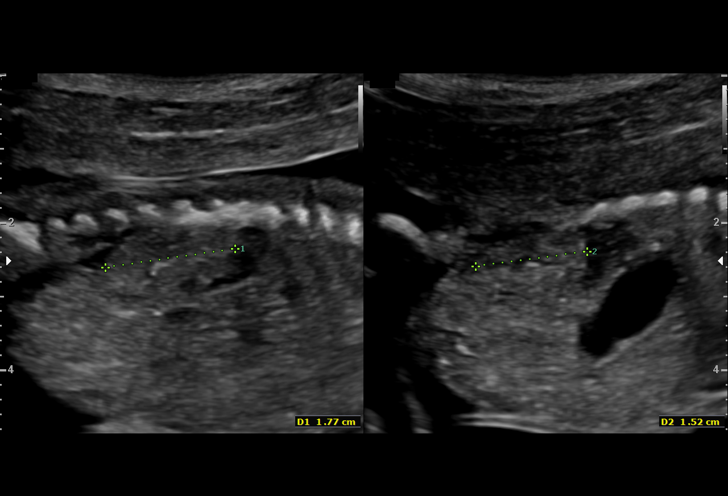
[im 18/95]
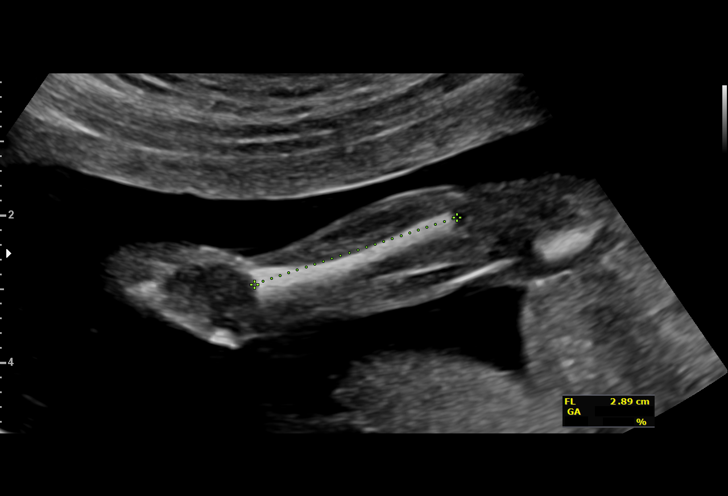
[im 25/95]
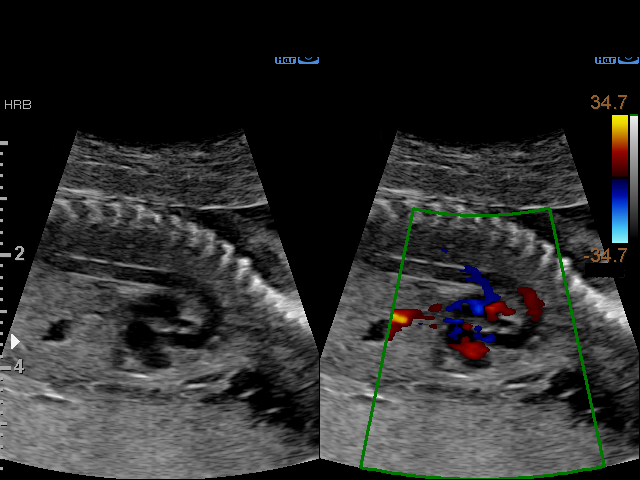
[im 32/95]
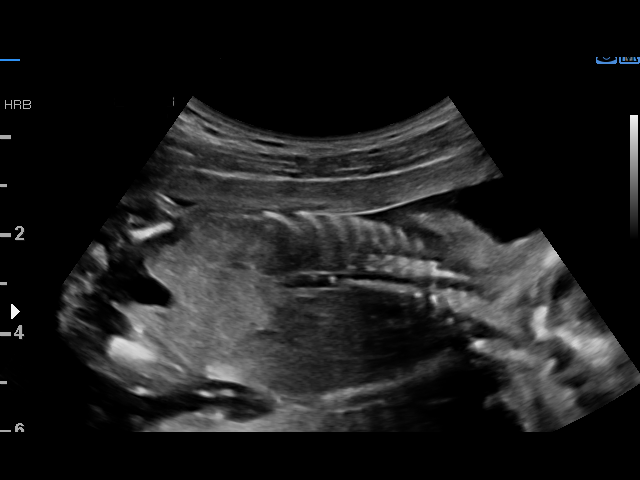
[im 39/95]
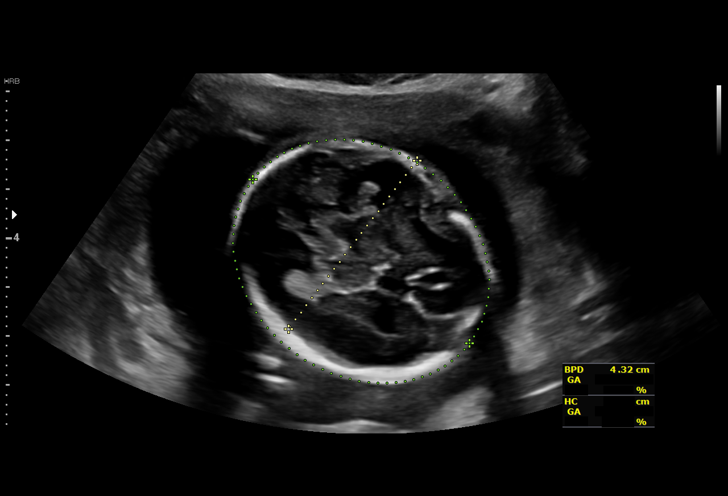
[im 46/95]
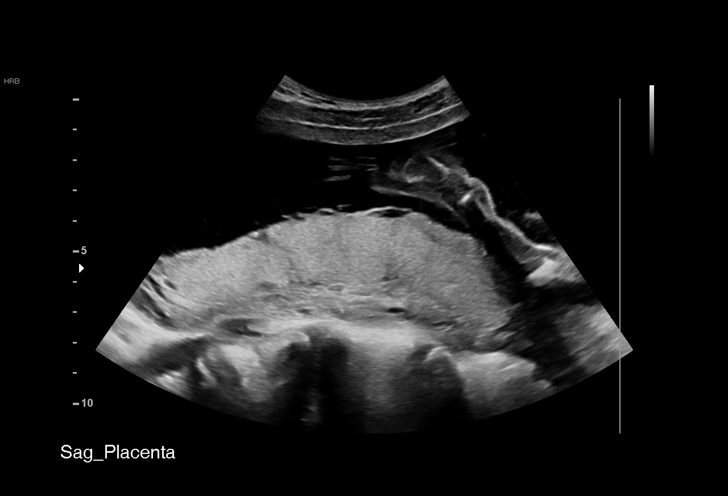
[im 53/95]
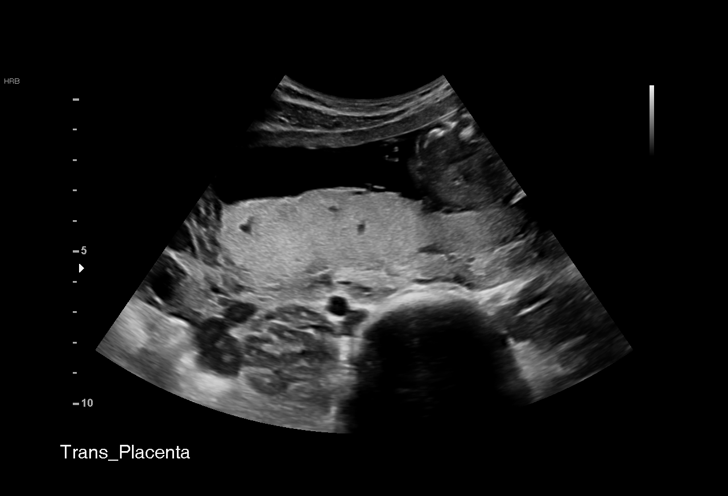
[im 60/95]
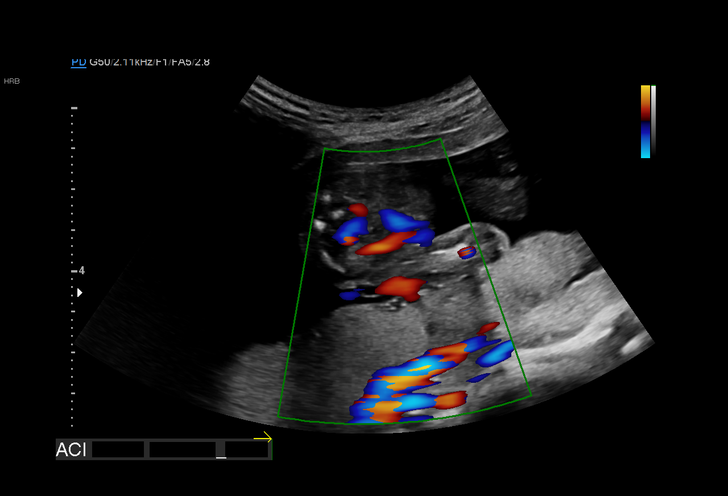
[im 67/95]
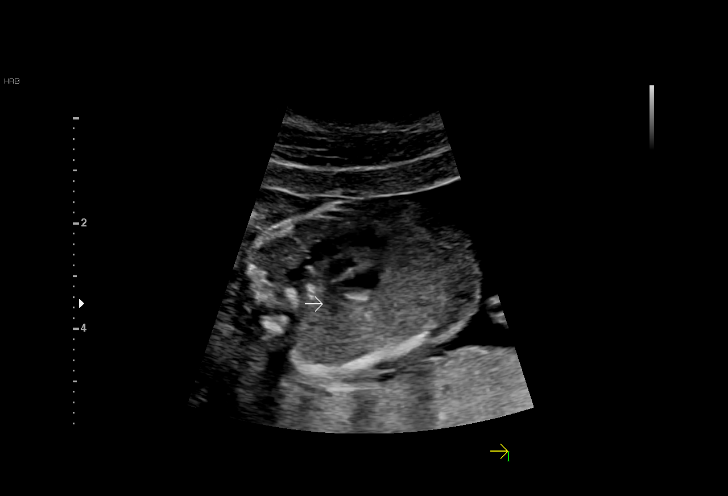
[im 74/95]
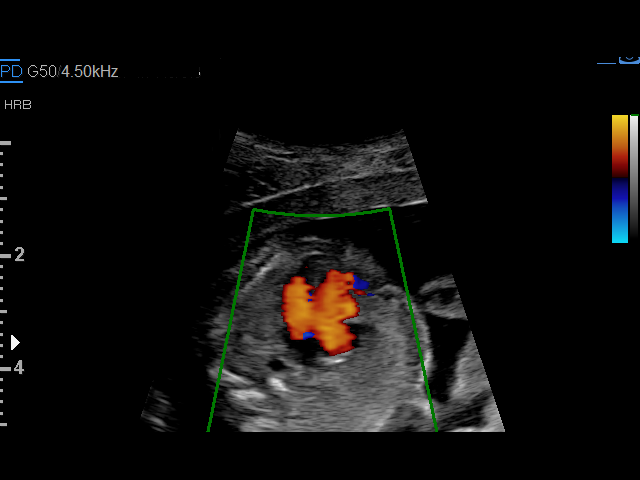
[im 81/95]
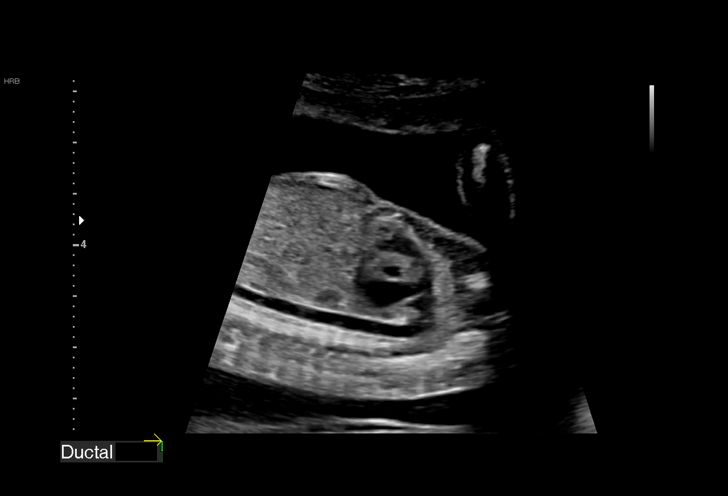
[im 88/95]
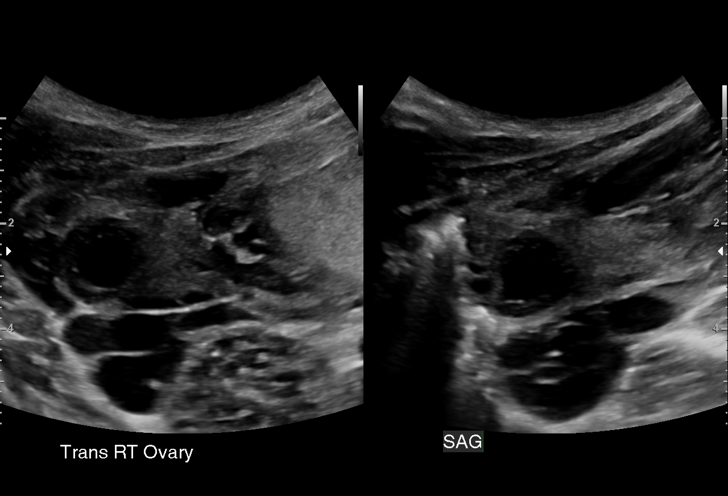
[im 95/95]
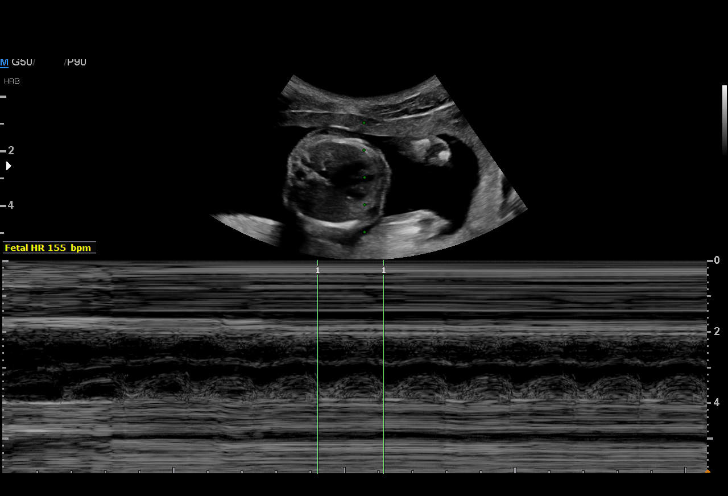

[14 of 28 positions shown; findings below may reference images not displayed]

[facility] [HOSPITAL]

1  FUNKE KUYKENDALL         [id number]      [id number]     [id number]
Indications

18 weeks gestation of pregnancy
Encounter for antenatal screening for
malformations
Short interval between pregancies, 2nd
trimester
OB History

Blood Type:            Height:  5'4"   Weight (lb):  109       BMI:
Gravidity:    3         Term:   1         SAB:   1
Living:       1
Fetal Evaluation

Num Of Fetuses:     1
Fetal Heart         155
Rate(bpm):
Cardiac Activity:   Observed
Presentation:       Cephalic
Placenta:           Posterior, above cervical os
P. Cord Insertion:  Visualized, central

Amniotic Fluid
AFI FV:      Subjectively within normal limits

Largest Pocket(cm)
4.32
Biometry

BPD:      42.6  mm     G. Age:  18w 6d         52  %    CI:        72.84   %    70 - 86
FL/HC:      18.4   %    16.1 -
HC:      158.7  mm     G. Age:  18w 5d         37  %    HC/AC:      1.14        1.09 -
AC:      138.8  mm     G. Age:  19w 2d         60  %    FL/BPD:     68.5   %
FL:       29.2  mm     G. Age:  19w 0d         48  %    FL/AC:      21.0   %    20 - 24
HUM:      27.9  mm     G. Age:  19w 0d         54  %
CER:      19.8  mm     G. Age:  18w 6d         52  %
NFT:       4.7  mm
CM:        4.5  mm

Est. FW:     274  gm    0 lb 10 oz      50  %
Gestational Age

LMP:           18w 6d        Date:  11/29/17                 EDD:   09/05/18
U/S Today:     19w 0d                                        EDD:   09/04/18
Best:          18w 6d     Det. By:  LMP  (11/29/17)          EDD:   09/05/18
Anatomy

Cranium:               Appears normal         Aortic Arch:            Appears normal
Cavum:                 Appears normal         Ductal Arch:            Appears normal
Ventricles:            Appears normal         Diaphragm:              Appears normal
Choroid Plexus:        Appears normal         Stomach:                Appears normal, left
sided
Cerebellum:            Appears normal         Abdomen:                Appears normal
Posterior Fossa:       Appears normal         Abdominal Wall:         Appears nml (cord
insert, abd wall)
Nuchal Fold:           Appears normal         Cord Vessels:           Appears normal (3
vessel cord)
Face:                  Appears normal         Kidneys:                Appear normal
(orbits and profile)
Lips:                  Appears normal         Bladder:                Appears normal
Thoracic:              Appears normal         Spine:                  Appears normal
Heart:                 Appears normal         Upper Extremities:      Appears normal
(4CH, axis, and situs
RVOT:                  Appears normal         Lower Extremities:      Appears normal
LVOT:                  Appears normal

Other:  Female gender. Heels and 5th digit visualized. Open hands
visualized. Nasal bone visualized.
Cervix Uterus Adnexa

Cervix
Length:           3.72  cm.
Normal appearance by transabdominal scan.

Uterus
No abnormality visualized.

Left Ovary
Size(cm)     3.67   x   1.71   x  1.98      Vol(ml):
Within normal limits.

Right Ovary
Size(cm)     3.63   x   1.72   x  3.49      Vol(ml):
Within normal limits.  CLC

Cul De Sac:   No free fluid seen.

Adnexa:       No abnormality visualized.
Impression

Singleton intrauterine pregnancy at 18+6 weeks  here
foranatomic survey
Review of the anatomy shows no sonographic markers for
aneuploidy or structural anomalies
All relevant fetal anatomy has been visualized
Amniotic fluid volume is normal
Estimated fetal weight shows growth in the 50th percentile
Recommendations

Follow-up ultrasounds as clinically indicated.

## 2020-05-25 ENCOUNTER — Other Ambulatory Visit: Payer: Self-pay

## 2020-06-17 ENCOUNTER — Other Ambulatory Visit: Payer: Self-pay | Admitting: General Practice

## 2020-06-17 ENCOUNTER — Ambulatory Visit (INDEPENDENT_AMBULATORY_CARE_PROVIDER_SITE_OTHER): Payer: No Typology Code available for payment source

## 2020-06-17 ENCOUNTER — Other Ambulatory Visit: Payer: No Typology Code available for payment source

## 2020-06-17 ENCOUNTER — Other Ambulatory Visit (HOSPITAL_COMMUNITY)
Admission: RE | Admit: 2020-06-17 | Discharge: 2020-06-17 | Disposition: A | Discharge status: Home / Self Care | Payer: No Typology Code available for payment source | Source: Ambulatory Visit | Attending: Student | Admitting: Student

## 2020-06-17 ENCOUNTER — Other Ambulatory Visit: Payer: Self-pay

## 2020-06-17 VITALS — BP 105/67 | HR 81 | Wt 131.5 lb

## 2020-06-17 DIAGNOSIS — Z3493 Encounter for supervision of normal pregnancy, unspecified, third trimester: Secondary | ICD-10-CM

## 2020-06-17 DIAGNOSIS — O98313 Other infections with a predominantly sexual mode of transmission complicating pregnancy, third trimester: Secondary | ICD-10-CM

## 2020-06-17 DIAGNOSIS — A749 Chlamydial infection, unspecified: Secondary | ICD-10-CM | POA: Diagnosis not present

## 2020-06-17 DIAGNOSIS — Z23 Encounter for immunization: Secondary | ICD-10-CM

## 2020-06-17 DIAGNOSIS — Z8742 Personal history of other diseases of the female genital tract: Secondary | ICD-10-CM

## 2020-06-17 DIAGNOSIS — Z3A30 30 weeks gestation of pregnancy: Secondary | ICD-10-CM

## 2020-06-17 DIAGNOSIS — Z8616 Personal history of COVID-19: Secondary | ICD-10-CM

## 2020-06-17 NOTE — Patient Instructions (Signed)
Safe Medications in Pregnancy   Acne: Benzoyl Peroxide Salicylic Acid  Backache/Headache: Tylenol: 2 regular strength every 4 hours OR              2 Extra strength every 6 hours  Colds/Coughs/Allergies: Benadryl (alcohol free) 25 mg every 6 hours as needed Breath right strips Claritin Cepacol throat lozenges Chloraseptic throat spray Cold-Eeze- up to three times per day Cough drops, alcohol free Flonase (by prescription only) Guaifenesin Mucinex Robitussin DM (plain only, alcohol free) Saline nasal spray/drops Sudafed (pseudoephedrine) & Actifed ** use only after [redacted] weeks gestation and if you do not have high blood pressure Tylenol Vicks Vaporub Zinc lozenges Zyrtec   Constipation: Colace Ducolax suppositories Fleet enema Glycerin suppositories Metamucil Milk of magnesia Miralax Senokot Smooth move tea  Diarrhea: Kaopectate Imodium A-D  *NO pepto Bismol  Hemorrhoids: Anusol Anusol HC Preparation H Tucks  Indigestion: Tums Maalox Mylanta Zantac  Pepcid  Insomnia: Benadryl (alcohol free) 25mg  every 6 hours as needed Tylenol PM Unisom, no Gelcaps  Leg Cramps: Tums MagGel  Nausea/Vomiting:  Bonine Dramamine Emetrol Ginger extract Sea bands Meclizine  Nausea medication to take during pregnancy:  Unisom (doxylamine succinate 25 mg tablets) Take one tablet daily at bedtime. If symptoms are not adequately controlled, the dose can be increased to a maximum recommended dose of two tablets daily (1/2 tablet in the morning, 1/2 tablet mid-afternoon and one at bedtime). Vitamin B6 100mg  tablets. Take one tablet twice a day (up to 200 mg per day).  Skin Rashes: Aveeno products Benadryl cream or 25mg  every 6 hours as needed Calamine Lotion 1% cortisone cream  Yeast infection: Gyne-lotrimin 7 Monistat 7   **If taking multiple medications, please check labels to avoid duplicating the same active ingredients **take medication as directed on  the label ** Do not exceed 4000 mg of tylenol in 24 hours **Do not take medications that contain aspirin or ibuprofen     Glucose Tolerance Test During Pregnancy Why am I having this test? The glucose tolerance test (GTT) is done to check how your body processes sugar (glucose). This is one of several tests used to diagnose diabetes that develops during pregnancy (gestational diabetes mellitus). Gestational diabetes is a temporary form of diabetes that some women develop during pregnancy. It usually occurs during the second trimester of pregnancy and goes away after delivery. Testing (screening) for gestational diabetes usually occurs between 24 and 28 weeks of pregnancy. You may have the GTT test after having a 1-hour glucose screening test if the results from that test indicate that you may have gestational diabetes. You may also have this test if:  You have a history of gestational diabetes.  You have a history of giving birth to very large babies or have experienced repeated fetal loss (stillbirth).  You have signs and symptoms of diabetes, such as: ? Changes in your vision. ? Tingling or numbness in your hands or feet. ? Changes in hunger, thirst, and urination that are not otherwise explained by your pregnancy. What is being tested? This test measures the amount of glucose in your blood at different times during a period of 3 hours. This indicates how well your body is able to process glucose. What kind of sample is taken?  Blood samples are required for this test. They are usually collected by inserting a needle into a blood vessel. How do I prepare for this test?  For 3 days before your test, eat normally. Have plenty of carbohydrate-rich foods.  Follow instructions  from your health care provider about: ? Eating or drinking restrictions on the day of the test. You may be asked to not eat or drink anything other than water (fast) starting 8-10 hours before the test. ? Changing  or stopping your regular medicines. Some medicines may interfere with this test. Tell a health care provider about:  All medicines you are taking, including vitamins, herbs, eye drops, creams, and over-the-counter medicines.  Any blood disorders you have.  Any surgeries you have had.  Any medical conditions you have. What happens during the test? First, your blood glucose will be measured. This is referred to as your fasting blood glucose, since you fasted before the test. Then, you will drink a glucose solution that contains a certain amount of glucose. Your blood glucose will be measured again 1, 2, and 3 hours after drinking the solution. This test takes about 3 hours to complete. You will need to stay at the testing location during this time. During the testing period:  Do not eat or drink anything other than the glucose solution.  Do not exercise.  Do not use any products that contain nicotine or tobacco, such as cigarettes and e-cigarettes. If you need help stopping, ask your health care provider. The testing procedure may vary among health care providers and hospitals. How are the results reported? Your results will be reported as milligrams of glucose per deciliter of blood (mg/dL) or millimoles per liter (mmol/L). Your health care provider will compare your results to normal ranges that were established after testing a large group of people (reference ranges). Reference ranges may vary among labs and hospitals. For this test, common reference ranges are:  Fasting: less than 95-105 mg/dL (3.8-7.5 mmol/L).  1 hour after drinking glucose: less than 180-190 mg/dL (64.3-32.9 mmol/L).  2 hours after drinking glucose: less than 155-165 mg/dL (5.1-8.8 mmol/L).  3 hours after drinking glucose: 140-145 mg/dL (4.1-6.6 mmol/L). What do the results mean? Results within reference ranges are considered normal, meaning that your glucose levels are well-controlled. If two or more of your blood  glucose levels are high, you may be diagnosed with gestational diabetes. If only one level is high, your health care provider may suggest repeat testing or other tests to confirm a diagnosis. Talk with your health care provider about what your results mean. Questions to ask your health care provider Ask your health care provider, or the department that is doing the test:  When will my results be ready?  How will I get my results?  What are my treatment options?  What other tests do I need?  What are my next steps? Summary  The glucose tolerance test (GTT) is one of several tests used to diagnose diabetes that develops during pregnancy (gestational diabetes mellitus). Gestational diabetes is a temporary form of diabetes that some women develop during pregnancy.  You may have the GTT test after having a 1-hour glucose screening test if the results from that test indicate that you may have gestational diabetes. You may also have this test if you have any symptoms or risk factors for gestational diabetes.  Talk with your health care provider about what your results mean. This information is not intended to replace advice given to you by your health care provider. Make sure you discuss any questions you have with your health care provider. Document Revised: 03/05/2019 Document Reviewed: 06/24/2017 Elsevier Patient Education  2020 [employer].

## 2020-06-17 NOTE — Progress Notes (Signed)
   PRENATAL VISIT NOTE  Subjective:  Jaclyn Delgado is a 24 y.o. [phone] at [redacted]w[redacted]d being seen today for ongoing prenatal care.  She is currently monitored for the following issues for this low-risk pregnancy and has Abnormal Papanicolaou smear of cervix; Supervision of low-risk pregnancy; History of COVID-19; and Chlamydia infection affecting pregnancy on their problem list.  Patient reports no complaints.  Contractions: Irritability. Vag. Bleeding: None.  Movement: Present. Denies leaking of fluid.   The following portions of the patient's history were reviewed and updated as appropriate: allergies, current medications, past family history, past medical history, past social history, past surgical history and problem list.   Objective:   Vitals:   06/17/20 0901  BP: 105/67  Pulse: 81  Weight: 131 lb 8 oz (59.6 kg)    Fetal Status: Fetal Heart Rate (bpm): 153 Fundal Height: 29 cm Movement: Present     General:  Alert, oriented and cooperative. Patient is in no acute distress.  Skin: Skin is warm and dry. No rash noted.   Cardiovascular: Normal heart rate noted  Respiratory: Normal respiratory effort, no problems with respiration noted  Abdomen: Soft, gravid, appropriate for gestational age.  Pain/Pressure: Present     Pelvic: Cervical exam deferred        Extremities: Normal range of motion.  Edema: None  Mental Status: Normal mood and affect. Normal behavior. Normal judgment and thought content.   Assessment and Plan:  Pregnancy: [id number] at [redacted]w[redacted]d 1. Chlamydia -TOC today  2. Encounter for supervision of low-risk pregnancy in third trimester -GTT and labs today. Anticipatory guidance provided.  - Tdap vaccine greater than or equal to 7yo IM -Reviewed contraception options. Patient has tried pills and depo in the past but did not like them. Discussed LARCs and answered questions. Patient still considering options. -Discussed COVID vaccine. Patient declines stating she had COVID  in early pregnancy. Reviewed CDC recommendation for vaccination even after infection and patient verbalized understanding.   3. [redacted] weeks gestation of pregnancy  Preterm labor symptoms and general obstetric precautions including but not limited to vaginal bleeding, contractions, leaking of fluid and fetal movement were reviewed in detail with the patient. Please refer to After Visit Summary for other counseling recommendations.   Return in about 2 weeks (around 07/01/2020) for Return OB visit.  Future Appointments  Date Time Provider Department Center  06/17/2020 10:00 AM [facility]    Rolm Bookbinder, [place] 06/17/20 9:20 AM

## 2020-06-18 LAB — CBC
Hematocrit: 26.1 % — ABNORMAL LOW (ref 34.0–46.6)
Hemoglobin: 8.1 g/dL — ABNORMAL LOW (ref 11.1–15.9)
MCH: 23.4 pg — ABNORMAL LOW (ref 26.6–33.0)
MCHC: 31 g/dL — ABNORMAL LOW (ref 31.5–35.7)
MCV: 75 fL — ABNORMAL LOW (ref 79–97)
Platelets: 239 10*3/uL (ref 150–450)
RBC: 3.46 x10E6/uL — ABNORMAL LOW (ref 3.77–5.28)
RDW: 14.5 % (ref 11.7–15.4)
WBC: 4 10*3/uL (ref 3.4–10.8)

## 2020-06-18 LAB — GLUCOSE TOLERANCE, 2 HOURS W/ 1HR
Glucose, 1 hour: 85 mg/dL (ref 65–179)
Glucose, 2 hour: 85 mg/dL (ref 65–152)
Glucose, Fasting: 66 mg/dL (ref 65–91)

## 2020-06-18 LAB — HIV ANTIBODY (ROUTINE TESTING W REFLEX): HIV Screen 4th Generation wRfx: NONREACTIVE

## 2020-06-18 LAB — RPR: RPR Ser Ql: NONREACTIVE

## 2020-06-20 LAB — CERVICOVAGINAL ANCILLARY ONLY
Chlamydia: NEGATIVE
Comment: NEGATIVE
Comment: NORMAL
Neisseria Gonorrhea: NEGATIVE

## 2020-06-21 ENCOUNTER — Other Ambulatory Visit: Payer: Self-pay

## 2020-06-21 ENCOUNTER — Telehealth: Payer: Self-pay

## 2020-06-21 ENCOUNTER — Encounter: Payer: No Typology Code available for payment source | Admitting: Student

## 2020-06-21 NOTE — Telephone Encounter (Addendum)
-----   Message from Rolm Bookbinder, [place] sent at 06/18/2020 12:40 PM EDT ----- Can we schedule her for feraheme infusions please  L/M with Short Stay to schedule feraheme if they could please give a call back.    Addison Naegeli, RN   Received appt for Feraheme for 06/24/20 @ 1200.  L/M for pt that we have scheduled her iron infusion appt and that she will be able to get address and telephone number via MyCart.  If she has any questions to please give the office a call.   Addison Naegeli, RN  06/21/20

## 2020-06-21 NOTE — Progress Notes (Signed)
Order placed for Feraheme.  Addison Naegeli, RN

## 2020-06-23 NOTE — Discharge Instructions (Signed)

## 2020-06-24 ENCOUNTER — Other Ambulatory Visit: Payer: Self-pay

## 2020-06-24 ENCOUNTER — Ambulatory Visit (HOSPITAL_COMMUNITY)
Admission: RE | Admit: 2020-06-24 | Discharge: 2020-06-24 | Disposition: A | Discharge status: Home / Self Care | Payer: No Typology Code available for payment source | Source: Ambulatory Visit | Attending: Family Medicine | Admitting: Family Medicine

## 2020-06-24 DIAGNOSIS — D649 Anemia, unspecified: Secondary | ICD-10-CM | POA: Insufficient documentation

## 2020-06-24 DIAGNOSIS — O99019 Anemia complicating pregnancy, unspecified trimester: Secondary | ICD-10-CM | POA: Diagnosis present

## 2020-06-24 DIAGNOSIS — Z3A00 Weeks of gestation of pregnancy not specified: Secondary | ICD-10-CM | POA: Insufficient documentation

## 2020-06-24 MED ORDER — SODIUM CHLORIDE 0.9 % IV SOLN
510.0000 mg | INTRAVENOUS | Status: DC
  Administered 2020-06-24: 510 mg via INTRAVENOUS
  Filled 2020-06-24: qty 17

## 2020-07-01 ENCOUNTER — Other Ambulatory Visit: Payer: Self-pay

## 2020-07-01 ENCOUNTER — Ambulatory Visit (HOSPITAL_COMMUNITY)
Admission: RE | Admit: 2020-07-01 | Discharge: 2020-07-01 | Disposition: A | Discharge status: Home / Self Care | Payer: No Typology Code available for payment source | Source: Ambulatory Visit | Attending: Family Medicine | Admitting: Family Medicine

## 2020-07-01 DIAGNOSIS — D649 Anemia, unspecified: Secondary | ICD-10-CM | POA: Insufficient documentation

## 2020-07-01 DIAGNOSIS — Z3A00 Weeks of gestation of pregnancy not specified: Secondary | ICD-10-CM | POA: Insufficient documentation

## 2020-07-01 DIAGNOSIS — O99019 Anemia complicating pregnancy, unspecified trimester: Secondary | ICD-10-CM | POA: Insufficient documentation

## 2020-07-01 MED ORDER — SODIUM CHLORIDE 0.9 % IV SOLN
510.0000 mg | INTRAVENOUS | Status: AC
  Administered 2020-07-01: 510 mg via INTRAVENOUS
  Filled 2020-07-01: qty 17

## 2020-07-06 ENCOUNTER — Other Ambulatory Visit: Payer: Self-pay

## 2020-07-06 ENCOUNTER — Encounter: Payer: Self-pay | Admitting: Medical

## 2020-07-06 ENCOUNTER — Ambulatory Visit (INDEPENDENT_AMBULATORY_CARE_PROVIDER_SITE_OTHER): Payer: No Typology Code available for payment source | Admitting: Medical

## 2020-07-06 VITALS — BP 103/68 | HR 94 | Wt 131.7 lb

## 2020-07-06 DIAGNOSIS — A749 Chlamydial infection, unspecified: Secondary | ICD-10-CM

## 2020-07-06 DIAGNOSIS — O98812 Other maternal infectious and parasitic diseases complicating pregnancy, second trimester: Secondary | ICD-10-CM

## 2020-07-06 DIAGNOSIS — N949 Unspecified condition associated with female genital organs and menstrual cycle: Secondary | ICD-10-CM

## 2020-07-06 DIAGNOSIS — Z3493 Encounter for supervision of normal pregnancy, unspecified, third trimester: Secondary | ICD-10-CM

## 2020-07-06 DIAGNOSIS — Z3A33 33 weeks gestation of pregnancy: Secondary | ICD-10-CM

## 2020-07-06 MED ORDER — COMFORT FIT MATERNITY SUPP MED MISC: 1.0000 [IU] | Freq: Every day | 0 refills | Status: DC

## 2020-07-06 NOTE — Progress Notes (Signed)
   PRENATAL VISIT NOTE  Subjective:  Jaclyn Delgado is a 24 y.o. [phone] at [redacted]w[redacted]d being seen today for ongoing prenatal care.  She is currently monitored for the following issues for this high-risk pregnancy and has Abnormal Papanicolaou smear of cervix; Supervision of low-risk pregnancy; History of COVID-19; and Chlamydia infection affecting pregnancy on their problem list.  Patient reports intermittent sharp abdominal pain.  Contractions: Irritability. Vag. Bleeding: None.  Movement: Present. Denies leaking of fluid.   The following portions of the patient's history were reviewed and updated as appropriate: allergies, current medications, past family history, past medical history, past social history, past surgical history and problem list.   Objective:   Vitals:   07/06/20 1101  BP: 103/68  Pulse: 94  Weight: 131 lb 11.2 oz (59.7 kg)    Fetal Status: Fetal Heart Rate (bpm): 142 Fundal Height: 31 cm Movement: Present     General:  Alert, oriented and cooperative. Patient is in no acute distress.  Skin: Skin is warm and dry. No rash noted.   Cardiovascular: Normal heart rate noted  Respiratory: Normal respiratory effort, no problems with respiration noted  Abdomen: Soft, gravid, appropriate for gestational age.  Pain/Pressure: Present     Pelvic: Cervical exam deferred        Extremities: Normal range of motion.  Edema: None  Mental Status: Normal mood and affect. Normal behavior. Normal judgment and thought content.   Assessment and Plan:  Pregnancy: [id number] at [redacted]w[redacted]d 1. Encounter for supervision of low-risk pregnancy in third trimester - Normal GTT discussed  - Has had both doses of Fereheme   2. [redacted] weeks gestation of pregnancy  3. Chlamydia infection affecting pregnancy in second trimester - TOC negative 7/23  4. Round ligament pain - Elastic Bandages & Supports (COMFORT FIT MATERNITY SUPP MED) MISC; 1 Units by Does not apply route daily.  Dispense: 1 each; Refill:  0  Preterm labor symptoms and general obstetric precautions including but not limited to vaginal bleeding, contractions, leaking of fluid and fetal movement were reviewed in detail with the patient. Please refer to After Visit Summary for other counseling recommendations.   Return in about 2 weeks (around 07/20/2020) for LOB, Virtual.  Future Appointments  Date Time Provider Department Center  07/25/2020  3:55 PM [facility]    Vonzella Nipple, PA-C

## 2020-07-06 NOTE — Patient Instructions (Addendum)
Fetal Movement Counts Patient Name: ________________________________________________ Patient Due Date: ____________________ What is a fetal movement count?  A fetal movement count is the number of times that you feel your baby move during a certain amount of time. This may also be called a fetal kick count. A fetal movement count is recommended for every pregnant woman. You may be asked to start counting fetal movements as early as week 28 of your pregnancy. Pay attention to when your baby is most active. You may notice your baby's sleep and wake cycles. You may also notice things that make your baby move more. You should do a fetal movement count:  When your baby is normally most active.  At the same time each day. A good time to count movements is while you are resting, after having something to eat and drink. How do I count fetal movements? 1. Find a quiet, comfortable area. Sit, or lie down on your side. 2. Write down the date, the start time and stop time, and the number of movements that you felt between those two times. Take this information with you to your health care visits. 3. Write down your start time when you feel the first movement. 4. Count kicks, flutters, swishes, rolls, and jabs. You should feel at least 10 movements. 5. You may stop counting after you have felt 10 movements, or if you have been counting for 2 hours. Write down the stop time. 6. If you do not feel 10 movements in 2 hours, contact your health care provider for further instructions. Your health care provider may want to do additional tests to assess your baby's well-being. Contact a health care provider if:  You feel fewer than 10 movements in 2 hours.  Your baby is not moving like he or she usually does. Date: ____________ Start time: ____________ Stop time: ____________ Movements: ____________ Date: ____________ Start time: ____________ Stop time: ____________ Movements: ____________ Date: ____________  Start time: ____________ Stop time: ____________ Movements: ____________ Date: ____________ Start time: ____________ Stop time: ____________ Movements: ____________ Date: ____________ Start time: ____________ Stop time: ____________ Movements: ____________ Date: ____________ Start time: ____________ Stop time: ____________ Movements: ____________ Date: ____________ Start time: ____________ Stop time: ____________ Movements: ____________ Date: ____________ Start time: ____________ Stop time: ____________ Movements: ____________ Date: ____________ Start time: ____________ Stop time: ____________ Movements: ____________ This information is not intended to replace advice given to you by your health care provider. Make sure you discuss any questions you have with your health care provider. Document Revised: 07/02/2019 Document Reviewed: 07/02/2019 Elsevier Patient Education  2020 [employer] Braxton Hicks Contractions Contractions of the uterus can occur throughout pregnancy, but they are not always a sign that you are in labor. You may have practice contractions called Braxton Hicks contractions. These false labor contractions are sometimes confused with true labor. What are Braxton Hicks contractions? Braxton Hicks contractions are tightening movements that occur in the muscles of the uterus before labor. Unlike true labor contractions, these contractions do not result in opening (dilation) and thinning of the cervix. Toward the end of pregnancy (32-34 weeks), Braxton Hicks contractions can happen more often and may become stronger. These contractions are sometimes difficult to tell apart from true labor because they can be very uncomfortable. You should not feel embarrassed if you go to the hospital with false labor. Sometimes, the only way to tell if you are in true labor is for your health care provider to look for changes in the cervix. The health care provider   will do a physical exam and may  monitor your contractions. If you are not in true labor, the exam should show that your cervix is not dilating and your water has not broken. If there are no other health problems associated with your pregnancy, it is completely safe for you to be sent home with false labor. You may continue to have Braxton Hicks contractions until you go into true labor. How to tell the difference between true labor and false labor True labor  Contractions last 30-70 seconds.  Contractions become very regular.  Discomfort is usually felt in the top of the uterus, and it spreads to the lower abdomen and low back.  Contractions do not go away with walking.  Contractions usually become more intense and increase in frequency.  The cervix dilates and gets thinner. False labor  Contractions are usually shorter and not as strong as true labor contractions.  Contractions are usually irregular.  Contractions are often felt in the front of the lower abdomen and in the groin.  Contractions may go away when you walk around or change positions while lying down.  Contractions get weaker and are shorter-lasting as time goes on.  The cervix usually does not dilate or become thin. Follow these instructions at home:   Take over-the-counter and prescription medicines only as told by your health care provider.  Keep up with your usual exercises and follow other instructions from your health care provider.  Eat and drink lightly if you think you are going into labor.  If Braxton Hicks contractions are making you uncomfortable: ? Change your position from lying down or resting to walking, or change from walking to resting. ? Sit and rest in a tub of warm water. ? Drink enough fluid to keep your urine pale yellow. Dehydration may cause these contractions. ? Do slow and deep breathing several times an hour.  Keep all follow-up prenatal visits as told by your health care provider. This is important. Contact a  health care provider if:  You have a fever.  You have continuous pain in your abdomen. Get help right away if:  Your contractions become stronger, more regular, and closer together.  You have fluid leaking or gushing from your vagina.  You pass blood-tinged mucus (bloody show).  You have bleeding from your vagina.  You have low back pain that you never had before.  You feel your baby's head pushing down and causing pelvic pressure.  Your baby is not moving inside you as much as it used to. Summary  Contractions that occur before labor are called Braxton Hicks contractions, false labor, or practice contractions.  Braxton Hicks contractions are usually shorter, weaker, farther apart, and less regular than true labor contractions. True labor contractions usually become progressively stronger and regular, and they become more frequent.  Manage discomfort from [facility] contractions by changing position, resting in a warm bath, drinking plenty of water, or practicing deep breathing. This information is not intended to replace advice given to you by your health care provider. Make sure you discuss any questions you have with your health care provider. Document Revised: 10/25/2017 Document Reviewed: 03/28/2017 Elsevier Patient Education  [address]  [facility]   [address] [place], [place], [place] [address] Phone: [phone]  Monday     8:30AM-5PM Tuesday 8:30AM-5PM Wednesday 8:30AM-5PM Thursday 8:30AM-5PM Friday  8:30AM-5PM Saturday Closed Sunday Closed

## 2020-07-25 ENCOUNTER — Telehealth: Payer: No Typology Code available for payment source

## 2020-07-25 DIAGNOSIS — Z3493 Encounter for supervision of normal pregnancy, unspecified, third trimester: Secondary | ICD-10-CM

## 2020-07-25 NOTE — Progress Notes (Signed)
3:57P:  Called Pt to start My Chart visit, no answer & mailbox is full. Will call back in 10 to 15 minutes.   4:11P- 2nd attempt, still no answer, mailbox full.Will need to reschedule.

## 2020-08-04 ENCOUNTER — Other Ambulatory Visit (HOSPITAL_COMMUNITY)
Admission: RE | Admit: 2020-08-04 | Discharge: 2020-08-04 | Disposition: A | Discharge status: Home / Self Care | Payer: No Typology Code available for payment source | Source: Ambulatory Visit | Attending: Nurse Practitioner | Admitting: Nurse Practitioner

## 2020-08-04 ENCOUNTER — Ambulatory Visit (INDEPENDENT_AMBULATORY_CARE_PROVIDER_SITE_OTHER): Payer: No Typology Code available for payment source | Admitting: Nurse Practitioner

## 2020-08-04 ENCOUNTER — Other Ambulatory Visit: Payer: Self-pay

## 2020-08-04 VITALS — BP 108/74 | HR 85 | Wt 131.5 lb

## 2020-08-04 DIAGNOSIS — O99013 Anemia complicating pregnancy, third trimester: Secondary | ICD-10-CM | POA: Insufficient documentation

## 2020-08-04 DIAGNOSIS — Z3493 Encounter for supervision of normal pregnancy, unspecified, third trimester: Secondary | ICD-10-CM

## 2020-08-04 DIAGNOSIS — Z3A37 37 weeks gestation of pregnancy: Secondary | ICD-10-CM

## 2020-08-04 NOTE — Progress Notes (Signed)
    Subjective:  Jaclyn Delgado is a 24 y.o. [phone] at [redacted]w[redacted]d being seen today for ongoing prenatal care.  She is currently monitored for the following issues for this low-risk pregnancy and has Abnormal Papanicolaou smear of cervix; Supervision of low-risk pregnancy; History of COVID-19; and Chlamydia infection affecting pregnancy on their problem list.  Patient reports occasional contractions.  Contractions: Irritability. Vag. Bleeding: None.  Movement: Present. Denies leaking of fluid.   The following portions of the patient's history were reviewed and updated as appropriate: allergies, current medications, past family history, past medical history, past social history, past surgical history and problem list. Problem list updated.  Objective:   Vitals:   08/04/20 1430  BP: 108/74  Pulse: 85  Weight: 131 lb 8 oz (59.6 kg)    Fetal Status: Fetal Heart Rate (bpm): 160 Fundal Height: 35 cm Movement: Present  Presentation: Vertex  General:  Alert, oriented and cooperative. Patient is in no acute distress.  Skin: Skin is warm and dry. No rash noted.   Cardiovascular: Normal heart rate noted  Respiratory: Normal respiratory effort, no problems with respiration noted  Abdomen: Soft, gravid, appropriate for gestational age. Pain/Pressure: Present     Pelvic:  Cervical exam performed Dilation: 1 Effacement (%): Thick Station: -2  Extremities: Normal range of motion.  Edema: None  Mental Status: Normal mood and affect. Normal behavior. Normal judgment and thought content.   Urinalysis:      Assessment and Plan:  Pregnancy: [id number] at [redacted]w[redacted]d  1. Encounter for supervision of low-risk pregnancy in third trimester Encouraged to consider Covid vaccine - client is hesitant  - CBC - Culture, beta strep (group b only) - Cervicovaginal ancillary only( [place])  2. [redacted] weeks gestation of pregnancy   3. Anemia in pregnancy, third trimester Got 2 doses of fereheme Checking CBC  today  Term labor symptoms and general obstetric precautions including but not limited to vaginal bleeding, contractions, leaking of fluid and fetal movement were reviewed in detail with the patient. Please refer to After Visit Summary for other counseling recommendations.  Return in about 1 week (around 08/11/2020) for ROB.  Nolene Bernheim, RN, MSN, NP-BC Nurse Practitioner, [facility] for [employer], [facility] 08/04/2020 2:57 PM

## 2020-08-05 LAB — CERVICOVAGINAL ANCILLARY ONLY
Bacterial Vaginitis (gardnerella): POSITIVE — AB
Candida Glabrata: NEGATIVE
Candida Vaginitis: NEGATIVE
Chlamydia: NEGATIVE
Comment: NEGATIVE
Comment: NEGATIVE
Comment: NEGATIVE
Comment: NEGATIVE
Comment: NEGATIVE
Comment: NORMAL
Neisseria Gonorrhea: NEGATIVE
Trichomonas: NEGATIVE

## 2020-08-05 LAB — CBC
Hematocrit: 32.3 % — ABNORMAL LOW (ref 34.0–46.6)
Hemoglobin: 10.5 g/dL — ABNORMAL LOW (ref 11.1–15.9)
MCH: 26.6 pg (ref 26.6–33.0)
MCHC: 32.5 g/dL (ref 31.5–35.7)
MCV: 82 fL (ref 79–97)
Platelets: 226 10*3/uL (ref 150–450)
RBC: 3.95 x10E6/uL (ref 3.77–5.28)
WBC: 4.3 10*3/uL (ref 3.4–10.8)

## 2020-08-07 LAB — CULTURE, BETA STREP (GROUP B ONLY): Strep Gp B Culture: NEGATIVE

## 2020-08-10 ENCOUNTER — Inpatient Hospital Stay (HOSPITAL_COMMUNITY)
Admission: AD | Admit: 2020-08-10 | Discharge: 2020-08-12 | DRG: 807 | Disposition: A | Discharge status: Home / Self Care | Payer: No Typology Code available for payment source | Attending: Obstetrics and Gynecology | Admitting: Obstetrics and Gynecology

## 2020-08-10 ENCOUNTER — Encounter (HOSPITAL_COMMUNITY): Payer: Self-pay | Admitting: Anesthesiology

## 2020-08-10 ENCOUNTER — Encounter (HOSPITAL_COMMUNITY): Payer: Self-pay | Admitting: Obstetrics & Gynecology

## 2020-08-10 ENCOUNTER — Other Ambulatory Visit: Payer: Self-pay

## 2020-08-10 DIAGNOSIS — O99013 Anemia complicating pregnancy, third trimester: Secondary | ICD-10-CM | POA: Diagnosis present

## 2020-08-10 DIAGNOSIS — O368390 Maternal care for abnormalities of the fetal heart rate or rhythm, unspecified trimester, not applicable or unspecified: Secondary | ICD-10-CM

## 2020-08-10 DIAGNOSIS — O9902 Anemia complicating childbirth: Secondary | ICD-10-CM | POA: Diagnosis present

## 2020-08-10 DIAGNOSIS — Z3A38 38 weeks gestation of pregnancy: Secondary | ICD-10-CM

## 2020-08-10 DIAGNOSIS — Z20822 Contact with and (suspected) exposure to covid-19: Secondary | ICD-10-CM | POA: Diagnosis present

## 2020-08-10 DIAGNOSIS — O26893 Other specified pregnancy related conditions, third trimester: Secondary | ICD-10-CM | POA: Diagnosis present

## 2020-08-10 LAB — URINALYSIS, ROUTINE W REFLEX MICROSCOPIC
Bilirubin Urine: NEGATIVE
Glucose, UA: NEGATIVE mg/dL
Ketones, ur: NEGATIVE mg/dL
Leukocytes,Ua: NEGATIVE
Nitrite: NEGATIVE
Protein, ur: NEGATIVE mg/dL
Specific Gravity, Urine: 1.016 (ref 1.005–1.030)
pH: 7 (ref 5.0–8.0)

## 2020-08-10 LAB — CBC
HCT: 36 % (ref 36.0–46.0)
Hemoglobin: 11.4 g/dL — ABNORMAL LOW (ref 12.0–15.0)
MCH: 26.6 pg (ref 26.0–34.0)
MCHC: 31.7 g/dL (ref 30.0–36.0)
MCV: 83.9 fL (ref 80.0–100.0)
Platelets: 228 10*3/uL (ref 150–400)
RBC: 4.29 MIL/uL (ref 3.87–5.11)
RDW: 22.5 % — ABNORMAL HIGH (ref 11.5–15.5)
WBC: 5.6 10*3/uL (ref 4.0–10.5)
nRBC: 0 % (ref 0.0–0.2)

## 2020-08-10 LAB — TYPE AND SCREEN
ABO/RH(D): O POS
Antibody Screen: NEGATIVE

## 2020-08-10 LAB — SARS CORONAVIRUS 2 BY RT PCR (HOSPITAL ORDER, PERFORMED IN ~~LOC~~ HOSPITAL LAB): SARS Coronavirus 2: NEGATIVE

## 2020-08-10 LAB — WET PREP, GENITAL
Clue Cells Wet Prep HPF POC: NONE SEEN
Sperm: NONE SEEN
Trich, Wet Prep: NONE SEEN
Yeast Wet Prep HPF POC: NONE SEEN

## 2020-08-10 MED ORDER — PRENATAL MULTIVITAMIN CH
1.0000 | ORAL_TABLET | Freq: Every day | ORAL | Status: DC
  Filled 2020-08-10: qty 1

## 2020-08-10 MED ORDER — FENTANYL CITRATE (PF) 100 MCG/2ML IJ SOLN
50.0000 ug | INTRAMUSCULAR | Status: DC | PRN
  Administered 2020-08-10: 50 ug via INTRAVENOUS
  Filled 2020-08-10: qty 2

## 2020-08-10 MED ORDER — PHENYLEPHRINE 40 MCG/ML (10ML) SYRINGE FOR IV PUSH (FOR BLOOD PRESSURE SUPPORT): 80.0000 ug | PREFILLED_SYRINGE | INTRAVENOUS | Status: DC | PRN

## 2020-08-10 MED ORDER — MEASLES, MUMPS & RUBELLA VAC IJ SOLR: 0.5000 mL | Freq: Once | INTRAMUSCULAR | Status: DC

## 2020-08-10 MED ORDER — DIPHENHYDRAMINE HCL 25 MG PO CAPS: 25.0000 mg | ORAL_CAPSULE | Freq: Four times a day (QID) | ORAL | Status: DC | PRN

## 2020-08-10 MED ORDER — DIPHENHYDRAMINE HCL 50 MG/ML IJ SOLN: 12.5000 mg | INTRAMUSCULAR | Status: DC | PRN

## 2020-08-10 MED ORDER — FENTANYL-BUPIVACAINE-NACL 0.5-0.125-0.9 MG/250ML-% EP SOLN
EPIDURAL | Status: DC
Start: 2020-08-10 — End: 2020-08-10
  Filled 2020-08-10: qty 250

## 2020-08-10 MED ORDER — ACETAMINOPHEN 325 MG PO TABS: 650.0000 mg | ORAL_TABLET | ORAL | Status: DC | PRN

## 2020-08-10 MED ORDER — ONDANSETRON HCL 4 MG/2ML IJ SOLN: 4.0000 mg | Freq: Four times a day (QID) | INTRAMUSCULAR | Status: DC | PRN

## 2020-08-10 MED ORDER — OXYTOCIN BOLUS FROM INFUSION
333.0000 mL | Freq: Once | INTRAVENOUS | Status: AC
  Administered 2020-08-10: 333 mL via INTRAVENOUS

## 2020-08-10 MED ORDER — OXYCODONE-ACETAMINOPHEN 5-325 MG PO TABS
1.0000 | ORAL_TABLET | ORAL | Status: DC | PRN
  Administered 2020-08-10: 1 via ORAL
  Filled 2020-08-10: qty 1

## 2020-08-10 MED ORDER — SENNOSIDES-DOCUSATE SODIUM 8.6-50 MG PO TABS
2.0000 | ORAL_TABLET | ORAL | Status: DC
  Administered 2020-08-10: 2 via ORAL
  Filled 2020-08-10: qty 2

## 2020-08-10 MED ORDER — OXYTOCIN-SODIUM CHLORIDE 30-0.9 UT/500ML-% IV SOLN
2.5000 [IU]/h | INTRAVENOUS | Status: DC
  Filled 2020-08-10: qty 500

## 2020-08-10 MED ORDER — DOCUSATE SODIUM 100 MG PO CAPS
100.0000 mg | ORAL_CAPSULE | Freq: Two times a day (BID) | ORAL | Status: DC
  Administered 2020-08-10: 100 mg via ORAL
  Filled 2020-08-10: qty 1

## 2020-08-10 MED ORDER — DIBUCAINE (PERIANAL) 1 % EX OINT: 1.0000 "application " | TOPICAL_OINTMENT | CUTANEOUS | Status: DC | PRN

## 2020-08-10 MED ORDER — FENTANYL-BUPIVACAINE-NACL 0.5-0.125-0.9 MG/250ML-% EP SOLN: 12.0000 mL/h | EPIDURAL | Status: DC | PRN

## 2020-08-10 MED ORDER — TETANUS-DIPHTH-ACELL PERTUSSIS 5-2.5-18.5 LF-MCG/0.5 IM SUSP: 0.5000 mL | Freq: Once | INTRAMUSCULAR | Status: DC

## 2020-08-10 MED ORDER — SOD CITRATE-CITRIC ACID 500-334 MG/5ML PO SOLN: 30.0000 mL | ORAL | Status: DC | PRN

## 2020-08-10 MED ORDER — EPHEDRINE 5 MG/ML INJ: 10.0000 mg | INTRAVENOUS | Status: DC | PRN

## 2020-08-10 MED ORDER — LACTATED RINGERS IV SOLN: INTRAVENOUS | Status: DC

## 2020-08-10 MED ORDER — LACTATED RINGERS IV SOLN: 500.0000 mL | Freq: Once | INTRAVENOUS | Status: DC

## 2020-08-10 MED ORDER — OXYCODONE-ACETAMINOPHEN 5-325 MG PO TABS: 2.0000 | ORAL_TABLET | ORAL | Status: DC | PRN

## 2020-08-10 MED ORDER — SIMETHICONE 80 MG PO CHEW: 80.0000 mg | CHEWABLE_TABLET | ORAL | Status: DC | PRN

## 2020-08-10 MED ORDER — IBUPROFEN 600 MG PO TABS: 600.0000 mg | ORAL_TABLET | Freq: Four times a day (QID) | ORAL | Status: DC

## 2020-08-10 MED ORDER — COCONUT OIL OIL: 1.0000 "application " | TOPICAL_OIL | Status: DC | PRN

## 2020-08-10 MED ORDER — LIDOCAINE HCL (PF) 1 % IJ SOLN: 30.0000 mL | INTRAMUSCULAR | Status: DC | PRN

## 2020-08-10 MED ORDER — FLEET ENEMA 7-19 GM/118ML RE ENEM: 1.0000 | ENEMA | RECTAL | Status: DC | PRN

## 2020-08-10 MED ORDER — BENZOCAINE-MENTHOL 20-0.5 % EX AERO: 1.0000 "application " | INHALATION_SPRAY | CUTANEOUS | Status: DC | PRN

## 2020-08-10 MED ORDER — ONDANSETRON HCL 4 MG PO TABS: 4.0000 mg | ORAL_TABLET | ORAL | Status: DC | PRN

## 2020-08-10 MED ORDER — IBUPROFEN 100 MG/5ML PO SUSP
600.0000 mg | Freq: Four times a day (QID) | ORAL | Status: DC
  Administered 2020-08-10 – 2020-08-12 (×6): 600 mg via ORAL
  Filled 2020-08-10 (×6): qty 30

## 2020-08-10 MED ORDER — ACETAMINOPHEN 160 MG/5ML PO SOLN
650.0000 mg | ORAL | Status: DC | PRN
  Administered 2020-08-11 (×2): 650 mg via ORAL
  Filled 2020-08-10 (×2): qty 20.3

## 2020-08-10 MED ORDER — LACTATED RINGERS IV SOLN: 500.0000 mL | INTRAVENOUS | Status: DC | PRN

## 2020-08-10 MED ORDER — WITCH HAZEL-GLYCERIN EX PADS: 1.0000 "application " | MEDICATED_PAD | CUTANEOUS | Status: DC | PRN

## 2020-08-10 MED ORDER — ONDANSETRON HCL 4 MG/2ML IJ SOLN: 4.0000 mg | INTRAMUSCULAR | Status: DC | PRN

## 2020-08-10 NOTE — MAU Note (Signed)
.   Jaclyn Delgado is a 24 y.o. at [redacted]w[redacted]d here in MAU reporting: she noticed vaginal bleeding when she went to the bathroom today. Denies any LOF. Reports contractions for weeks.   Onset of complaint: ongoing Pain score: 4 Vitals:   08/10/20 1121  BP: 113/80  Pulse: 77  Resp: 16  Temp: 98.2 F (36.8 C)  SpO2: 100%     FHT:150 Lab orders placed from triage: UA

## 2020-08-10 NOTE — MAU Provider Note (Signed)
Chief Complaint:  Contractions and Vaginal Bleeding   First Provider Initiated Contact with Patient 08/10/20 1322     HPI: Jaclyn Delgado is a 24 y.o. [id number] at [redacted]w[redacted]d who presents to maternity admissions reporting vaginal bleeding. Started this afternoon. Noticed on toilet paper and also has some spots on a pad. Initially was pink/red, last time she used the restroom it was brown. Has been having contractions about every 10 minutes for the last several days. No other symptoms. No recent intercourse.  Location: abdomen Quality: cramping Severity: 4/10 in pain scale Duration: 1 week Timing: every 10 minutes Modifying factors: none Associated signs and symptoms: vaginal bleeding  Pregnancy Course: MCW  Past Medical History:  Diagnosis Date   Medical history non-contributory    OB History  Gravida Para Term Preterm AB Living  5 2 2  0 2 2  SAB TAB Ectopic Multiple Live Births  0 0 0 0 2    # Outcome Date GA Lbr Len/2nd Weight Sex Delivery Anes PTL Lv  5 Current           4 AB 06/2019          3 Term 09/05/18 [redacted]w[redacted]d 05:45 / 00:33 2909 g F Vag-Spont EPI  LIV     Birth Comments: wnl  2 Term 12/26/16 [redacted]w[redacted]d 19:30 / 01:19 2770 g F Vag-Spont EPI  LIV     Birth Comments: wnl  1 AB 2016           Past Surgical History:  Procedure Laterality Date   NO PAST SURGERIES     Family History  Problem Relation Age of Onset   Renal Disease Mother    Bipolar disorder Mother    Hypertension Mother    Social History   Tobacco Use   Smoking status: Never Smoker   Smokeless tobacco: Never Used  Vaping Use   Vaping Use: Never used  Substance Use Topics   Alcohol use: Not Currently    Comment: socially   Drug use: No   No Known Allergies Medications Prior to Admission  Medication Sig Dispense Refill Last Dose   Prenatal MV & Min w/FA-DHA (PRENATAL ADULT GUMMY/DHA/FA PO) Take 1 tablet by mouth daily.   08/09/2020 at Unknown time   acetaminophen (TYLENOL) 325 MG tablet Take  650 mg by mouth every 6 (six) hours as needed.      Elastic Bandages & Supports (COMFORT FIT MATERNITY SUPP MED) MISC 1 Units by Does not apply route daily. 1 each 0     I have reviewed patient's Past Medical Hx, Surgical Hx, Family Hx, Social Hx, medications and allergies.   ROS:  Review of Systems  Constitutional: Negative.   Gastrointestinal: Positive for abdominal pain. Negative for diarrhea, nausea and vomiting.  Genitourinary: Positive for vaginal bleeding. Negative for dysuria and vaginal discharge.    Physical Exam   Patient Vitals for the past 24 hrs:  BP Temp Pulse Resp SpO2 Height Weight  08/10/20 1121 113/80 98.2 F (36.8 C) 77 16 100 % 5\' 4"  (1.626 m) 59.4 kg    Constitutional: Well-developed, well-nourished female in no acute distress.  Cardiovascular: normal rate & rhythm, no murmur Respiratory: normal effort, lung sounds clear throughout GI: Abd soft, non-tender, gravid appropriate for gestational age. Pos BS x 4 MS: Extremities nontender, no edema, normal ROM Neurologic: Alert and oriented x 4.  GU:      Pelvic: NEFG, blood tinged mucoid discharge. No active bleeding.   Dilation: 4 Effacement (%):  60 Cervical Position: Middle Station: -3 Presentation: Vertex Exam by:: Judeth Horn, NP  Fetal Tracing:  Baseline: 145 Variability: moderate Accelerations: 15x15 Decelerations: variable decels  Toco: Q2-5 minutes    Labs: Results for orders placed or performed during the hospital encounter of 08/10/20 (from the past 24 hour(s))  Urinalysis, Routine w reflex microscopic Urine, Clean Catch     Status: Abnormal   Collection Time: 08/10/20 12:45 PM  Result Value Ref Range   Color, Urine YELLOW YELLOW   APPearance CLEAR CLEAR   Specific Gravity, Urine 1.016 1.005 - 1.030   pH 7.0 5.0 - 8.0   Glucose, UA NEGATIVE NEGATIVE mg/dL   Hgb urine dipstick MODERATE (A) NEGATIVE   Bilirubin Urine NEGATIVE NEGATIVE   Ketones, ur NEGATIVE NEGATIVE mg/dL    Protein, ur NEGATIVE NEGATIVE mg/dL   Nitrite NEGATIVE NEGATIVE   Leukocytes,Ua NEGATIVE NEGATIVE   RBC / HPF 0-5 0 - 5 RBC/hpf   WBC, UA 0-5 0 - 5 WBC/hpf   Bacteria, UA RARE (A) NONE SEEN   Squamous Epithelial / LPF 0-5 0 - 5   Mucus PRESENT   Wet prep, genital     Status: Abnormal   Collection Time: 08/10/20  1:48 PM  Result Value Ref Range   Yeast Wet Prep HPF POC NONE SEEN NONE SEEN   Trich, Wet Prep NONE SEEN NONE SEEN   Clue Cells Wet Prep HPF POC NONE SEEN NONE SEEN   WBC, Wet Prep HPF POC MANY (A) NONE SEEN   Sperm NONE SEEN     Imaging:  No results found.  MAU Course: Orders Placed This Encounter  Procedures   Wet prep, genital   Urinalysis, Routine w reflex microscopic Urine, Clean Catch   No orders of the defined types were placed in this encounter.   MDM: RH positive Blood tinged mucous noted on exam. No frank bleeding and no pooling of fluid.   Fetal tracing initially reactive, more recently some variable decelerations. On recheck, cervix has changed from 2.5 to 4 cm. Will admit to birthing suites.   Assessment: 1. Indication for care in labor and delivery, antepartum   2. [redacted] weeks gestation of pregnancy   3. Variable fetal heart rate decelerations, antepartum     Plan: Admit to birthing suites Care turned over to labor team  Judeth Horn, NP 08/10/2020 3:25 PM

## 2020-08-10 NOTE — H&P (Signed)
OBSTETRIC ADMISSION HISTORY AND PHYSICAL  Jaclyn Delgado is a 24 y.o. female [phone] with IUP at [redacted]w[redacted]d by 14wk [place] presenting for labor. She initially presented to MAU for vaginal bleeding and some intermittent contractions. Continued to progress into labor and was 8cm by arrival to L&D, advancing to complete and delivery rapidly.   She plans on breast feeding. She requests outpatient IUD for birth control. She received her prenatal care at [facility]   Dating: By 14wk [place] --->  Estimated Date of Delivery: 08/24/20  Prenatal History/Complications:  --anemia in pregnancy, s/p 2 dose of IV feraheme  -chlamydia in second trimester, negative TOC 7/23   Past Medical History: Past Medical History:  Diagnosis Date   Medical history non-contributory     Past Surgical History: Past Surgical History:  Procedure Laterality Date   NO PAST SURGERIES      Obstetrical History: OB History    Gravida  5   Para  2   Term  2   Preterm  0   AB  2   Living  2     SAB  0   TAB  0   Ectopic  0   Multiple  0   Live Births  2           Social History Social History   Socioeconomic History   Marital status: Single    Spouse name: Not on file   Number of children: Not on file   Years of education: Not on file   Highest education level: Not on file  Occupational History   Not on file  Tobacco Use   Smoking status: Never Smoker   Smokeless tobacco: Never Used  Vaping Use   Vaping Use: Never used  Substance and Sexual Activity   Alcohol use: Not Currently    Comment: socially   Drug use: No   Sexual activity: Not Currently    Birth control/protection: None  Other Topics Concern   Not on file  Social History Narrative   ** Merged History Encounter **       Social Determinants of Health   Financial Resource Strain:    Difficulty of Paying Living Expenses: Not on file  Food Insecurity: No Food Insecurity   Worried About Running Out of Food in the Last  Year: Never true   Ran Out of Food in the Last Year: Never true  Transportation Needs: No Transportation Needs   Lack of Transportation (Medical): No   Lack of Transportation (Non-Medical): No  Physical Activity:    Days of Exercise per Week: Not on file   Minutes of Exercise per Session: Not on file  Stress:    Feeling of Stress : Not on file  Social Connections:    Frequency of Communication with Friends and Family: Not on file   Frequency of Social Gatherings with Friends and Family: Not on file   Attends Religious Services: Not on file   Active Member of Clubs or Organizations: Not on file   Attends Banker Meetings: Not on file   Marital Status: Not on file    Family History: Family History  Problem Relation Age of Onset   Renal Disease Mother    Bipolar disorder Mother    Hypertension Mother     Allergies: No Known Allergies  Medications Prior to Admission  Medication Sig Dispense Refill Last Dose   Prenatal MV & Min w/FA-DHA (PRENATAL ADULT GUMMY/DHA/FA PO) Take 1 tablet by mouth daily.  08/09/2020 at Unknown time   acetaminophen (TYLENOL) 325 MG tablet Take 650 mg by mouth every 6 (six) hours as needed.      Elastic Bandages & Supports (COMFORT FIT MATERNITY SUPP MED) MISC 1 Units by Does not apply route daily. 1 each 0      Review of Systems   All systems reviewed and negative except as stated in HPI  Blood pressure 113/80, pulse 77, temperature 98.2 F (36.8 C), resp. rate 16, height 5\' 4"  (1.626 m), weight 59.4 kg, last menstrual period 11/18/2019, SpO2 100 %, not currently breastfeeding. General appearance: alert, cooperative and appears stated age Lungs: clear to auscultation bilaterally Heart: regular rate and rhythm Abdomen: soft, non-tender; bowel sounds normal Extremities: Homans sign is negative, no sign of DVT Presentation: cephalic Fetal monitoring: Baseline 145, mod variability, pos accels, neg decels  Uterine  activity q2-3 min  Dilation: 10 Effacement (%): 100 Station: Plus 2 Exam by:: [email or web address]   Prenatal labs: ABO, Rh: --/--/O POS (09/15 1541) Antibody: NEG (09/15 1541) Rubella: 5.43 (05/05 1443) RPR: Non Reactive (07/23 0841)  HBsAg: Negative (05/05 1443)  HIV: Non Reactive (07/23 0841)  GBS: Negative/-- (09/09 1538)  2 hr Glucola neg Genetic screening  declined Anatomy 09-13-2002 normal  Prenatal Transfer Tool  Maternal Diabetes: No Genetic Screening: Declined Maternal Ultrasounds/Referrals: Normal Fetal Ultrasounds or other Referrals:  None Maternal Substance Abuse:  No Significant Maternal Medications:  None Significant Maternal Lab Results: Group B Strep negative  Results for orders placed or performed during the hospital encounter of 08/10/20 (from the past 24 hour(s))  Urinalysis, Routine w reflex microscopic Urine, Clean Catch   Collection Time: 08/10/20 12:45 PM  Result Value Ref Range   Color, Urine YELLOW YELLOW   APPearance CLEAR CLEAR   Specific Gravity, Urine 1.016 1.005 - 1.030   pH 7.0 5.0 - 8.0   Glucose, UA NEGATIVE NEGATIVE mg/dL   Hgb urine dipstick MODERATE (A) NEGATIVE   Bilirubin Urine NEGATIVE NEGATIVE   Ketones, ur NEGATIVE NEGATIVE mg/dL   Protein, ur NEGATIVE NEGATIVE mg/dL   Nitrite NEGATIVE NEGATIVE   Leukocytes,Ua NEGATIVE NEGATIVE   RBC / HPF 0-5 0 - 5 RBC/hpf   WBC, UA 0-5 0 - 5 WBC/hpf   Bacteria, UA RARE (A) NONE SEEN   Squamous Epithelial / LPF 0-5 0 - 5   Mucus PRESENT   Wet prep, genital   Collection Time: 08/10/20  1:48 PM  Result Value Ref Range   Yeast Wet Prep HPF POC NONE SEEN NONE SEEN   Trich, Wet Prep NONE SEEN NONE SEEN   Clue Cells Wet Prep HPF POC NONE SEEN NONE SEEN   WBC, Wet Prep HPF POC MANY (A) NONE SEEN   Sperm NONE SEEN   Type and screen [facility]   Collection Time: 08/10/20  3:41 PM  Result Value Ref Range   ABO/RH(D) O POS    Antibody Screen NEG    Sample Expiration       08/13/2020,2359 Performed at [facility], [address] [place], [place]   CBC   Collection Time: 08/10/20  3:51 PM  Result Value Ref Range   WBC 5.6 4.0 - 10.5 K/uL   RBC 4.29 3.87 - 5.11 MIL/uL   Hemoglobin 11.4 (L) 12.0 - 15.0 g/dL   HCT 08/12/20 36 - 46 %   MCV 83.9 80.0 - 100.0 fL   MCH 26.6 26.0 - 34.0 pg   MCHC 31.7 30.0 -  36.0 g/dL   RDW 91.6 (H) 60.6 - 00.4 %   Platelets 228 150 - 400 K/uL   nRBC 0.0 0.0 - 0.2 %  SARS Coronavirus 2 by RT PCR (hospital order, performed in [facility] hospital lab) Nasopharyngeal Nasopharyngeal Swab   Collection Time: 08/10/20  4:41 PM   Specimen: Nasopharyngeal Swab  Result Value Ref Range   SARS Coronavirus 2 NEGATIVE NEGATIVE    Patient Active Problem List   Diagnosis Date Noted   Indication for care in labor and delivery, antepartum 08/10/2020   Anemia in pregnancy, third trimester 08/04/2020   Chlamydia infection affecting pregnancy 04/02/2020   Supervision of low-risk pregnancy 03/23/2020   History of COVID-19 03/23/2020   Abnormal Papanicolaou smear of cervix 02/20/2018    Assessment/Plan:  Jaclyn Delgado is a 24 y.o. [id number] at [redacted]w[redacted]d here for labor.   #Labor: SVE on arrival from MAU 8cm, proceeded rapidly to complete and delivery. See delivery note for details.   #FWB: Cat I #ID:  gbs NEG #MOF: Breast #MOC:IUD, desires as outpatient.  #Circ:  N/A   Gita Kudo, MD  08/10/2020, 6:05 PM

## 2020-08-10 NOTE — Discharge Summary (Signed)
Postpartum Discharge Summary    Patient Name: Jaclyn Delgado DOB: [birth year] MRN: [id number]  Date of admission: 08/10/2020 Delivery date:08/10/2020  Delivering provider: Janet Berlin  Date of discharge: 08/11/2020  Admitting diagnosis: Indication for care in labor and delivery, antepartum [O75.9] Intrauterine pregnancy: [redacted]w[redacted]d    Secondary diagnosis:  Active Problems:   Anemia in pregnancy, third trimester   Indication for care in labor and delivery, antepartum   Term newborn delivered vaginally, current hospitalization  Additional problems: none    Discharge diagnosis: Term Pregnancy Delivered                                              Post partum procedures:none Augmentation: N/A Complications: None  Hospital course: Onset of Labor With Vaginal Delivery      24y.o. yo [id number] 34w0das admitted in Latent Labor on 08/10/2020. Patient had an uncomplicated labor course as follows:  Membrane Rupture Time/Date: 5:51 PM ,08/10/2020   Delivery Method:Vaginal, Spontaneous  Episiotomy: None  Lacerations:  None  Patient had an uncomplicated postpartum course.  She is ambulating, tolerating a regular diet, passing flatus, and urinating well. Patient is discharged home in stable condition on 08/11/20.  Newborn Data: Birth date:08/10/2020  Birth time:5:54 PM  Gender:Female  Living status:Living  Apgars:9 ,9  Weight:2801 g   Magnesium Sulfate received: No BMZ received: No Rhophylac:N/A MMR:No T-DaP:Given prenatally Flu: No Transfusion:No  Physical exam  Vitals:   08/10/20 2045 08/10/20 2342 08/11/20 0501 08/11/20 0900  BP: 126/77 99/67 107/75 105/84  Pulse: 100 74 68 (!) 57  Resp: _0 Temp: 97.9 F (36.6 C) 98.4 F (36.9 C) 98.6 F (37 C) (!) 97.5 F (36.4 C)  TempSrc: Oral Oral Oral Oral  SpO2: 100% 98% 99% 99%  Weight:      Height:       General: alert, cooperative and no distress Lochia: appropriate Uterine Fundus: firm Incision: N/A DVT  Evaluation: No significant calf/ankle edema. Labs: Lab Results  Component Value Date   WBC 5.6 08/10/2020   HGB 11.4 (L) 08/10/2020   HCT 36.0 08/10/2020   MCV 83.9 08/10/2020   PLT 228 08/10/2020   CMP Latest Ref Rng & Units 01/02/2015  Glucose 70 - 99 mg/dL 97  BUN 6 - 23 mg/dL 8  Creatinine 0.50 - 1.10 mg/dL 0.58  Sodium 135 - 145 mmol/L 137  Potassium 3.5 - 5.1 mmol/L 3.7  Chloride 96 - 112 mmol/L 104  CO2 19 - 32 mmol/L 30  Calcium 8.4 - 10.5 mg/dL 9.1  Total Protein 6.0 - 8.3 g/dL 7.4  Total Bilirubin 0.3 - 1.2 mg/dL 0.6  Alkaline Phos 39 - 117 U/L 57  AST 0 - 37 U/L 22  ALT 0 - 35 U/L 11   Edinburgh Score: Edinburgh Postnatal Depression Scale Screening Tool 10/16/2018  I have been able to laugh and see the funny side of things. 0  I have looked forward with enjoyment to things. 0  I have blamed myself unnecessarily when things went wrong. 0  I have been anxious or worried for no good reason. 0  I have felt scared or panicky for no good reason. 0  Things have been getting on top of me. 0  I have been so unhappy that I have had difficulty sleeping. 0  I have  felt sad or miserable. 0  I have been so unhappy that I have been crying. 0  The thought of harming myself has occurred to me. 0  Edinburgh Postnatal Depression Scale Total 0     After visit meds:  Allergies as of 08/11/2020   No Known Allergies     Medication List    TAKE these medications   acetaminophen 325 MG tablet Commonly known as: TYLENOL Take 650 mg by mouth every 6 (six) hours as needed.   Comfort Fit Maternity Supp Med Misc 1 Units by Does not apply route daily.   ibuprofen 200 MG tablet Commonly known as: Advil Take 3 tablets (600 mg total) by mouth every 6 (six) hours as needed for moderate pain or cramping.   PRENATAL ADULT GUMMY/DHA/FA PO Take 1 tablet by mouth daily.   senna-docusate 8.6-50 MG tablet Commonly known as: Senokot-S Take 2 tablets by mouth daily. Start taking on:  August 12, 2020        Discharge home in stable condition Infant Feeding: Breast Infant Disposition:home with mother Discharge instruction: per After Visit Summary and Postpartum booklet. Activity: Advance as tolerated. Pelvic rest for 6 weeks.  Diet: routine diet Future Appointments: Future Appointments  Date Time Provider [place]  09/09/2020  9:35 AM Luvenia Redden, PA-C [facility]   Follow up Visit:   Please schedule this patient for a in person postpartum visit in 6 weeks with the following provider: Any provider. Additional Postpartum F/U: IUD placement at 6wk visit  Low risk pregnancy complicated by: iron deficiency anemia (hgb 11.4 post-delivery)  Delivery mode:  Vaginal, Spontaneous  Anticipated Birth Control: IUD    08/11/2020 Patriciaann Clan, DO

## 2020-08-10 NOTE — Anesthesia Preprocedure Evaluation (Deleted)
Anesthesia Evaluation    Reviewed: Allergy & Precautions, Patient's Chart, lab work & pertinent test results  Airway        Dental   Pulmonary neg pulmonary ROS,           Cardiovascular      Neuro/Psych negative neurological ROS  negative psych ROS   GI/Hepatic negative GI ROS, Neg liver ROS,   Endo/Other  negative endocrine ROS  Renal/GU negative Renal ROS     Musculoskeletal negative musculoskeletal ROS (+)   Abdominal   Peds  Hematology Hgb 11.4 Plt 228   Anesthesia Other Findings   Reproductive/Obstetrics (+) Pregnancy                             Anesthesia Physical Anesthesia Plan  ASA: II  Anesthesia Plan: Epidural   Post-op Pain Management:    Induction:   PONV Risk Score and Plan: Treatment may vary due to age or medical condition  Airway Management Planned:   Additional Equipment:   Intra-op Plan:   Post-operative Plan:   Informed Consent:   Plan Discussed with:   Anesthesia Plan Comments: (38 Wk G5P2 for LEA)        Anesthesia Quick Evaluation

## 2020-08-11 LAB — RPR: RPR Ser Ql: NONREACTIVE

## 2020-08-11 MED ORDER — SENNOSIDES-DOCUSATE SODIUM 8.6-50 MG PO TABS: 2.0000 | ORAL_TABLET | ORAL | 0 refills | Status: DC

## 2020-08-11 MED ORDER — IBUPROFEN 200 MG PO TABS: 600.0000 mg | ORAL_TABLET | Freq: Four times a day (QID) | ORAL | 0 refills | Status: DC | PRN

## 2020-08-11 NOTE — Discharge Summary (Signed)
Postpartum Discharge Summary                          Patient Name: Jaclyn Delgado DOB: 05-17-1996 MRN: [id number]  Date of admission: 08/10/2020 Delivery date:08/10/2020  Delivering provider: Janet Berlin  Date of discharge: 08/11/2020  Admitting diagnosis: Indication for care in labor and delivery, antepartum [O75.9] Intrauterine pregnancy: [redacted]w[redacted]d    Secondary diagnosis:  Active Problems:   Anemia in pregnancy, third trimester   Indication for care in labor and delivery, antepartum   Term newborn delivered vaginally, current hospitalization  Additional problems: none                            Discharge diagnosis: Term Pregnancy Delivered                                              Post partum procedures:none Augmentation: N/A Complications: None  Hospital course: Onset of Labor With Vaginal Delivery      24y.o. yo [id number] 31w0das admitted in Latent Labor on 08/10/2020. Patient had an uncomplicated labor course as follows:  Membrane Rupture Time/Date: 5:51 PM ,08/10/2020   Delivery Method:Vaginal, Spontaneous  Episiotomy: None  Lacerations:  None  Patient had an uncomplicated postpartum course.  She is ambulating, tolerating a regular diet, passing flatus, and urinating well. Patient is discharged home in stable condition on 08/12/20.(discharge for 916 cancelled d/t baby staying).   Newborn Data: Birth date:08/10/2020  Birth time:5:54 PM  Gender:Female  Living status:Living  Apgars:9 ,9  Weight:2801 g   Magnesium Sulfate received: No BMZ received: No Rhophylac:N/A MMR:No T-DaP:Given prenatally Flu: No Transfusion:No  Physical exam        Vitals:   08/10/20 2045 08/10/20 2342 08/11/20 0501 08/11/20 0900  BP: 126/77 99/67 107/75 105/84  Pulse: 100 74 68 (!) 57  Resp: 16 16 16 17   Temp: 97.9 F (36.6 C) 98.4 F (36.9 C) 98.6 F (37 C) (!) 97.5 F (36.4 C)  TempSrc: Oral Oral Oral Oral  SpO2: 100% 98% 99% 99%  Weight:      Height:        General: alert, cooperative and no distress Lochia: appropriate Uterine Fundus: firm Incision: N/A DVT Evaluation: No significant calf/ankle edema. Labs: Recent Labs       Lab Results  Component Value Date   WBC 5.6 08/10/2020   HGB 11.4 (L) 08/10/2020   HCT 36.0 08/10/2020   MCV 83.9 08/10/2020   PLT 228 08/10/2020     CMP Latest Ref Rng & Units 01/02/2015  Glucose 70 - 99 mg/dL 97  BUN 6 - 23 mg/dL 8  Creatinine 0.50 - 1.10 mg/dL 0.58  Sodium 135 - 145 mmol/L 137  Potassium 3.5 - 5.1 mmol/L 3.7  Chloride 96 - 112 mmol/L 104  CO2 19 - 32 mmol/L 30  Calcium 8.4 - 10.5 mg/dL 9.1  Total Protein 6.0 - 8.3 g/dL 7.4  Total Bilirubin 0.3 - 1.2 mg/dL 0.6  Alkaline Phos 39 - 117 U/L 57  AST 0 - 37 U/L 22  ALT 0 - 35 U/L 11   Edinburgh Score: Edinburgh Postnatal Depression Scale Screening Tool 10/16/2018  I have been able to laugh and see the funny side of things. 0  I have looked forward  with enjoyment to things. 0  I have blamed myself unnecessarily when things went wrong. 0  I have been anxious or worried for no good reason. 0  I have felt scared or panicky for no good reason. 0  Things have been getting on top of me. 0  I have been so unhappy that I have had difficulty sleeping. 0  I have felt sad or miserable. 0  I have been so unhappy that I have been crying. 0  The thought of harming myself has occurred to me. 0  Edinburgh Postnatal Depression Scale Total 0     After visit meds:  Allergies as of 08/11/2020   No Known Allergies        Medication List    TAKE these medications   acetaminophen 325 MG tablet Commonly known as: TYLENOL Take 650 mg by mouth every 6 (six) hours as needed.   Comfort Fit Maternity Supp Med Misc 1 Units by Does not apply route daily.   ibuprofen 200 MG tablet Commonly known as: Advil Take 3 tablets (600 mg total) by mouth every 6 (six) hours as needed for moderate pain or cramping.   PRENATAL ADULT  GUMMY/DHA/FA PO Take 1 tablet by mouth daily.   senna-docusate 8.6-50 MG tablet Commonly known as: Senokot-S Take 2 tablets by mouth daily. Start taking on: August 12, 2020        Discharge home in stable condition Infant Feeding: Breast Infant Disposition:home with mother Discharge instruction: per After Visit Summary and Postpartum booklet. Activity: Advance as tolerated. Pelvic rest for 6 weeks.  Diet: routine diet Future Appointments:       Future Appointments  Date Time Provider [place]  09/09/2020  9:35 AM Luvenia Redden, PA-C [facility]   Follow up Visit:   Please schedule this patient for a in person postpartum visit in 6 weeks with the following provider: Any provider. Additional Postpartum F/U: IUD placement at 6wk visit  Low risk pregnancy complicated by: iron deficiency anemia (hgb 11.4 post-delivery)  Delivery mode: Vaginal, Spontaneous  Anticipated Birth Control:IUD    08/11/2020 Patriciaann Clan, DO  I saw and evaluated the patient. I agree with the findings and the plan of care as documented in the resident's note.  Sharene Skeans, MD [facility] Fellow, [facility] for [facility] [place]

## 2020-08-11 NOTE — Discharge Instructions (Signed)

## 2020-08-12 ENCOUNTER — Encounter: Payer: No Typology Code available for payment source | Admitting: Medical

## 2020-08-25 ENCOUNTER — Encounter: Payer: No Typology Code available for payment source | Admitting: Obstetrics and Gynecology

## 2020-09-09 ENCOUNTER — Ambulatory Visit: Payer: Self-pay | Admitting: Medical

## 2020-11-01 ENCOUNTER — Other Ambulatory Visit: Payer: Self-pay

## 2020-11-01 ENCOUNTER — Other Ambulatory Visit (HOSPITAL_COMMUNITY)
Admission: RE | Admit: 2020-11-01 | Discharge: 2020-11-01 | Disposition: A | Discharge status: Home / Self Care | Payer: Self-pay | Source: Ambulatory Visit | Attending: Nurse Practitioner | Admitting: Nurse Practitioner

## 2020-11-01 ENCOUNTER — Ambulatory Visit (INDEPENDENT_AMBULATORY_CARE_PROVIDER_SITE_OTHER): Payer: Self-pay | Admitting: Nurse Practitioner

## 2020-11-01 ENCOUNTER — Encounter: Payer: Self-pay | Admitting: Nurse Practitioner

## 2020-11-01 VITALS — BP 111/68 | HR 71 | Ht 64.0 in | Wt 124.7 lb

## 2020-11-01 DIAGNOSIS — N760 Acute vaginitis: Secondary | ICD-10-CM

## 2020-11-01 DIAGNOSIS — B9689 Other specified bacterial agents as the cause of diseases classified elsewhere: Secondary | ICD-10-CM

## 2020-11-01 DIAGNOSIS — N898 Other specified noninflammatory disorders of vagina: Secondary | ICD-10-CM | POA: Insufficient documentation

## 2020-11-01 NOTE — Progress Notes (Signed)
Pt states does not want to discuss BC.

## 2020-11-01 NOTE — Progress Notes (Signed)
    Post Partum Visit Note  Jaclyn Delgado is a 24 y.o. [phone] female who presents for a postpartum visit. She is 11 weeks postpartum following a normal spontaneous vaginal delivery.  I have fully reviewed the prenatal and intrapartum course. The delivery was at 38 gestational weeks.  Anesthesia: none. Postpartum course has been good. Baby is doing well. Baby is feeding by bottle - [employer] Good Start. Bleeding staining only. Bowel function is normal. Bladder function is normal. Patient is sexually active. Contraception method is condoms. Postpartum depression screening: negative.  Did have some vaginal spotting but otherwise was asymptomatic.  Requests vaginal swabs today to rule out infection.  The pregnancy intention screening data noted above was reviewed. Potential methods of contraception were discussed. The patient elected to proceed with Female Condom.      The following portions of the patient's history were reviewed and updated as appropriate: allergies, current medications, past family history, past medical history, past social history, past surgical history and problem list.  Review of Systems Pertinent items noted in HPI and remainder of comprehensive ROS otherwise negative.    Objective:  LMP 11/18/2019 Comment: one day of bleeding Feb 4   General:  alert, cooperative and no distress   Breasts:  deferred  Lungs: clear to auscultation bilaterally  Heart:  regular rate and rhythm, S1, S2 normal, no murmur, click, rub or gallop  Abdomen: not examined   Vulva:  not evaluated  Vagina: not evaluated  Cervix:  not evaluated  Corpus: not examined  Adnexa:  not evaluated  Rectal Exam: Not performed.        Assessment:    Normal postpartum exam. Pap smear not done at today's visit.   Plan:   Essential components of care per ACOG recommendations:  1.  Mood and well being: Patient with negative depression screening today. Reviewed local resources for support.  - Patient  does not use tobacco.  - hx of drug use? No    2. Infant care and feeding:  -Patient currently breastmilk feeding? No  -Social determinants of health (SDOH) reviewed in EPIC. No concerns.  Client informed of in house food pantry if needed at any time.  3. Sexuality, contraception and birth spacing - Patient does not want a pregnancy in the next year.  - Reviewed forms of contraception in tiered fashion. Patient desired condoms today.   - Discussed birth spacing of 18 months  4. Sleep and fatigue -Encouraged family/partner/community support of 4 hrs of uninterrupted sleep to help with mood and fatigue  5. Physical Recovery  - Discussed patients delivery - Patient had no laceration, perineal healing reviewed. Patient expressed understanding - Patient has urinary incontinence?  NO - Patient is safe to resume physical and sexual activity  6.  Health Maintenance - Last pap smear done 02-20-18 and was normal    Henrietta Dine, CMA [facility] [employer], [facility]  Nolene Bernheim, RN, MSN, NP-BC Nurse Practitioner, [facility] for [employer], [facility] 11/01/2020 9:48 PM

## 2020-11-03 LAB — CERVICOVAGINAL ANCILLARY ONLY
Bacterial Vaginitis (gardnerella): POSITIVE — AB
Candida Glabrata: NEGATIVE
Candida Vaginitis: NEGATIVE
Chlamydia: NEGATIVE
Comment: NEGATIVE
Comment: NEGATIVE
Comment: NEGATIVE
Comment: NEGATIVE
Comment: NEGATIVE
Comment: NORMAL
Neisseria Gonorrhea: NEGATIVE
Trichomonas: NEGATIVE

## 2020-11-03 MED ORDER — METRONIDAZOLE 500 MG PO TABS: 500.0000 mg | ORAL_TABLET | Freq: Two times a day (BID) | ORAL | 0 refills | Status: DC

## 2020-11-03 NOTE — Addendum Note (Signed)
Addended by: Currie Paris on: 11/03/2020 08:22 AM   Modules accepted: Orders

## 2021-01-24 ENCOUNTER — Encounter (HOSPITAL_COMMUNITY): Payer: Self-pay | Admitting: Emergency Medicine

## 2021-01-24 ENCOUNTER — Other Ambulatory Visit: Payer: Self-pay

## 2021-01-24 ENCOUNTER — Ambulatory Visit (HOSPITAL_COMMUNITY)
Admission: EM | Admit: 2021-01-24 | Discharge: 2021-01-24 | Disposition: A | Discharge status: Home / Self Care | Payer: BC Managed Care – PPO | Attending: Student | Admitting: Student

## 2021-01-24 DIAGNOSIS — L2082 Flexural eczema: Secondary | ICD-10-CM

## 2021-01-24 MED ORDER — PREDNISONE 20 MG PO TABS: 40.0000 mg | ORAL_TABLET | Freq: Every day | ORAL | 0 refills | Status: AC

## 2021-01-24 MED ORDER — TRIAMCINOLONE ACETONIDE 0.1 % EX CREA: 1.0000 "application " | TOPICAL_CREAM | Freq: Two times a day (BID) | CUTANEOUS | 0 refills | Status: DC

## 2021-01-24 NOTE — Discharge Instructions (Addendum)
-  Use the triamcinolone steroid cream 1-2 times daily.  Apply this to the affected areas, ideally after showering or bathing. -You can also use a thick emollient cream like Aquaphor or CeraVe on  the areas.  Make sure to wait at least 15 to 20 minutes after applying steroid cream to allow this to absorb properly. -I also sent a prescription for prednisone (Deltasone), 2 pills taken together daily in the morning.  Take this for 5 days.  This can give you energy, so take in the morning.

## 2021-01-24 NOTE — ED Provider Notes (Signed)
[facility]    CSN: [id number] Arrival date & time: 01/24/21  1636      History   Chief Complaint Chief Complaint  Patient presents with  . Eczema    HPI Jaclyn Delgado is a 25 y.o. female presenting with eczema x1 week.  History COVID, abnormal Pap smear, eczema.  Patient with complaint of eczema on right arm and bilateral legs for 1 week.  States that the itching is getting progressively worse.  Has not tried anything for her symptoms.  States she used to get eczema like this all the time, but has been awhile since last exacerbation.  Denies fever/chills, URI symptoms  HPI  Past Medical History:  Diagnosis Date  . Medical history non-contributory     Patient Active Problem List   Diagnosis Date Noted  . History of COVID-19 03/23/2020  . Abnormal Papanicolaou smear of cervix 02/20/2018    Past Surgical History:  Procedure Laterality Date  . NO PAST SURGERIES      OB History    Gravida  5   Para  3   Term  3   Preterm  0   AB  2   Living  3     SAB  0   IAB  0   Ectopic  0   Multiple  0   Live Births  3            Home Medications    Prior to Admission medications   Medication Sig Start Date End Date Taking? Authorizing Provider  predniSONE (DELTASONE) 20 MG tablet Take 2 tablets (40 mg total) by mouth daily for 5 days. 01/24/21 01/29/21 Yes Rhys Martini, PA-C  triamcinolone (KENALOG) 0.1 % Apply 1 application topically 2 (two) times daily. 01/24/21  Yes Rhys Martini, PA-C  acetaminophen (TYLENOL) 325 MG tablet Take 650 mg by mouth every 6 (six) hours as needed.    [provider]  Elastic Bandages & Supports (COMFORT FIT MATERNITY SUPP MED) MISC 1 Units by Does not apply route daily. Patient not taking: No sig reported 07/06/20   Marny Lowenstein, PA-C  ibuprofen (ADVIL) 200 MG tablet Take 3 tablets (600 mg total) by mouth every 6 (six) hours as needed for moderate pain or cramping. 08/11/20   Allayne Stack, DO   metroNIDAZOLE (FLAGYL) 500 MG tablet Take 1 tablet (500 mg total) by mouth 2 (two) times daily. No alcohol while taking this medication 11/03/20   Currie Paris, NP  Prenatal MV & Min w/FA-DHA (PRENATAL ADULT GUMMY/DHA/FA PO) Take 1 tablet by mouth daily.    [provider]  senna-docusate (SENOKOT-S) 8.6-50 MG tablet Take 2 tablets by mouth daily. Patient not taking: No sig reported 08/12/20   Allayne Stack, DO    Family History Family History  Problem Relation Age of Onset  . Renal Disease Mother   . Bipolar disorder Mother   . Hypertension Mother     Social History Social History   Tobacco Use  . Smoking status: Never Smoker  . Smokeless tobacco: Never Used  Vaping Use  . Vaping Use: Never used  Substance Use Topics  . Alcohol use: Not Currently    Comment: socially  . Drug use: No     Allergies   Patient has no known allergies.   Review of Systems Review of Systems  Skin: Positive for rash.  All other systems reviewed and are negative.    Physical Exam Triage Vital  Signs ED Triage Vitals  Enc Vitals Group     BP 01/24/21 1719 118/79     Pulse Rate 01/24/21 1719 68     Resp 01/24/21 1719 13     Temp 01/24/21 1719 98.2 F (36.8 C)     Temp Source 01/24/21 1719 Oral     SpO2 01/24/21 1719 99 %     Weight --      Height --      Head Circumference --      Peak Flow --      Pain Score 01/24/21 1718 0     Pain Loc --      Pain Edu? --      Excl. in GC? --    No data found.  Updated Vital Signs BP 118/79 (BP Location: Right Arm)   Pulse 68   Temp 98.2 F (36.8 C) (Oral)   Resp 13   LMP 01/03/2021   SpO2 99%   Visual Acuity Right Eye Distance:   Left Eye Distance:   Bilateral Distance:    Right Eye Near:   Left Eye Near:    Bilateral Near:     Physical Exam Vitals reviewed.  Constitutional:      Appearance: Normal appearance.  Cardiovascular:     Rate and Rhythm: Normal rate and regular rhythm.     Heart sounds: Normal  heart sounds.  Pulmonary:     Effort: Pulmonary effort is normal.     Breath sounds: Normal breath sounds.  Skin:    Comments: Flexural surfaces of LEs and R arm with erythematous and hyperpigmented patches. Nontender, not warm.  Neurological:     General: No focal deficit present.     Mental Status: She is alert and oriented to person, place, and time.  Psychiatric:        Mood and Affect: Mood normal.        Behavior: Behavior normal.        Thought Content: Thought content normal.        Judgment: Judgment normal.      UC Treatments / Results  Labs (all labs ordered are listed, but only abnormal results are displayed) Labs Reviewed - No data to display  EKG   Radiology No results found.  Procedures Procedures (including critical care time)  Medications Ordered in UC Medications - No data to display  Initial Impression / Assessment and Plan / UC Course  I have reviewed the triage vital signs and the nursing notes.  Pertinent labs & imaging results that were available during my care of the patient were reviewed by me and considered in my medical decision making (see chart for details).     This patient is a 25 year old female presenting with eczema exacerbation.  Plan to treat with prednisone and triamcinolone as below.  She is not a diabetic. Also rec thick emollient cream.  This chart was dictated using voice recognition software, Dragon. Despite the best efforts of this provider to proofread and correct errors, errors may still occur which can change documentation meaning.   Final Clinical Impressions(s) / UC Diagnoses   Final diagnoses:  Flexural eczema     Discharge Instructions     -Use the triamcinolone steroid cream 1-2 times daily.  Apply this to the affected areas, ideally after showering or bathing. -You can also use a thick emollient cream like Aquaphor or CeraVe on  the areas.  Make sure to wait at least 15 to 20 minutes after applying  steroid  cream to allow this to absorb properly. -I also sent a prescription for prednisone (Deltasone), 2 pills taken together daily in the morning.  Take this for 5 days.  This can give you energy, so take in the morning.    ED Prescriptions    Medication Sig Dispense Auth. Provider   triamcinolone (KENALOG) 0.1 % Apply 1 application topically 2 (two) times daily. 30 g Rhys Martini, PA-C   predniSONE (DELTASONE) 20 MG tablet Take 2 tablets (40 mg total) by mouth daily for 5 days. 10 tablet Rhys Martini, PA-C     PDMP not reviewed this encounter.   Rhys Martini, PA-C 01/24/21 1751

## 2021-01-24 NOTE — ED Triage Notes (Addendum)
Patient c/o eczema on both arms and  legs x 1 weeks.   Patient endorses that itching is progressively getting worst.   Patient hasn't used any medications for symptoms.

## 2021-04-03 ENCOUNTER — Other Ambulatory Visit (HOSPITAL_COMMUNITY)
Admission: RE | Admit: 2021-04-03 | Discharge: 2021-04-03 | Disposition: A | Discharge status: Home / Self Care | Payer: Self-pay | Source: Ambulatory Visit | Attending: Nurse Practitioner | Admitting: Nurse Practitioner

## 2021-04-03 ENCOUNTER — Other Ambulatory Visit: Payer: Self-pay

## 2021-04-03 ENCOUNTER — Ambulatory Visit (INDEPENDENT_AMBULATORY_CARE_PROVIDER_SITE_OTHER): Payer: Self-pay | Admitting: Nurse Practitioner

## 2021-04-03 ENCOUNTER — Encounter: Payer: Self-pay | Admitting: Nurse Practitioner

## 2021-04-03 VITALS — BP 112/80 | HR 83 | Wt 119.7 lb

## 2021-04-03 DIAGNOSIS — Z01419 Encounter for gynecological examination (general) (routine) without abnormal findings: Secondary | ICD-10-CM

## 2021-04-03 DIAGNOSIS — N898 Other specified noninflammatory disorders of vagina: Secondary | ICD-10-CM

## 2021-04-03 NOTE — Progress Notes (Signed)
GYNECOLOGY ANNUAL PREVENTATIVE CARE ENCOUNTER NOTE  Subjective:   Jaclyn Delgado is a 25 y.o. [phone] female here for a routine annual gynecologic exam.  Current complaints: periodic vaginal discharge with vaginal odor.   Denies abnormal vaginal bleeding, pelvic pain, problems with intercourse or other gynecologic concerns.    Gynecologic History Patient's last menstrual period was 03/29/2021 (exact date). Contraception: abstinence Last Pap: 02-20-18. Results were: normal   Obstetric History OB History  Gravida Para Term Preterm AB Living  5 3 3  0 2 3  SAB IAB Ectopic Multiple Live Births  0 0 0 0 3    # Outcome Date GA Lbr Len/2nd Weight Sex Delivery Anes PTL Lv  5 Term 08/10/20 [redacted]w[redacted]d 03:52 / 00:14 6 lb 2.8 oz (2.801 kg) F Vag-Spont None  LIV  4 AB 06/2019          3 Term 09/05/18 [redacted]w[redacted]d 05:45 / 00:33 6 lb 6.6 oz (2.909 kg) F Vag-Spont EPI  LIV     Birth Comments: wnl  2 Term 12/26/16 [redacted]w[redacted]d 19:30 / 01:19 6 lb 1.7 oz (2.77 kg) F Vag-Spont EPI  LIV     Birth Comments: wnl  1 AB 2016            Past Medical History:  Diagnosis Date  . Medical history non-contributory     Past Surgical History:  Procedure Laterality Date  . NO PAST SURGERIES      Current Outpatient Medications on File Prior to Visit  Medication Sig Dispense Refill  . acetaminophen (TYLENOL) 325 MG tablet Take 650 mg by mouth every 6 (six) hours as needed. (Patient not taking: Reported on 04/03/2021)    . Elastic Bandages & Supports (COMFORT FIT MATERNITY SUPP MED) MISC 1 Units by Does not apply route daily. (Patient not taking: No sig reported) 1 each 0  . ibuprofen (ADVIL) 200 MG tablet Take 3 tablets (600 mg total) by mouth every 6 (six) hours as needed for moderate pain or cramping. (Patient not taking: Reported on 04/03/2021) 30 tablet 0  . metroNIDAZOLE (FLAGYL) 500 MG tablet Take 1 tablet (500 mg total) by mouth 2 (two) times daily. No alcohol while taking this medication (Patient not taking: Reported  on 04/03/2021) 14 tablet 0  . Prenatal MV & Min w/FA-DHA (PRENATAL ADULT GUMMY/DHA/FA PO) Take 1 tablet by mouth daily. (Patient not taking: Reported on 04/03/2021)    . senna-docusate (SENOKOT-S) 8.6-50 MG tablet Take 2 tablets by mouth daily. (Patient not taking: No sig reported) 30 tablet 0  . triamcinolone (KENALOG) 0.1 % Apply 1 application topically 2 (two) times daily. (Patient not taking: Reported on 04/03/2021) 30 g 0   No current facility-administered medications on file prior to visit.    No Known Allergies  Social History   Socioeconomic History  . Marital status: Single    Spouse name: Not on file  . Number of children: Not on file  . Years of education: Not on file  . Highest education level: Not on file  Occupational History  . Not on file  Tobacco Use  . Smoking status: Never Smoker  . Smokeless tobacco: Never Used  Vaping Use  . Vaping Use: Never used  Substance and Sexual Activity  . Alcohol use: Not Currently    Comment: socially  . Drug use: No  . Sexual activity: Not Currently    Birth control/protection: None  Other Topics Concern  . Not on file  Social History Narrative   **  Merged History Encounter **       Social Determinants of Health   Financial Resource Strain: Not on file  Food Insecurity: No Food Insecurity  . Worried About Programme researcher, broadcasting/film/video in the Last Year: Never true  . Ran Out of Food in the Last Year: Never true  Transportation Needs: No Transportation Needs  . Lack of Transportation (Medical): No  . Lack of Transportation (Non-Medical): No  Physical Activity: Not on file  Stress: Not on file  Social Connections: Not on file  Intimate Partner Violence: Not on file    Family History  Problem Relation Age of Onset  . Renal Disease Mother   . Bipolar disorder Mother   . Hypertension Mother     The following portions of the patient's history were reviewed and updated as appropriate: allergies, current medications, past family  history, past medical history, past social history, past surgical history and problem list.  Review of Systems Pertinent items noted in HPI and remainder of comprehensive ROS otherwise negative.   Objective:  BP 112/80   Pulse 83   Wt 119 lb 11.2 oz (54.3 kg)   LMP 03/29/2021 (Exact Date)   BMI 20.55 kg/m  CONSTITUTIONAL: Well-developed, well-nourished female in no acute distress.  HENT:  Normocephalic, atraumatic, External right and left ear normal.  EYES: Conjunctivae and EOM are normal. Pupils are equal, round.  No scleral icterus.  NECK: Normal range of motion, supple, no masses.  Normal thyroid.  SKIN: Skin is warm and dry. No rash noted. Not diaphoretic. No erythema. No pallor. NEUROLOGIC: Alert and oriented to person, place, and time. Normal reflexes, muscle tone coordination. No cranial nerve deficit noted. PSYCHIATRIC: Normal mood and affect. Normal behavior. Normal judgment and thought content. CARDIOVASCULAR: Normal heart rate noted, regular rhythm RESPIRATORY: Clear to auscultation bilaterally. Effort and breath sounds normal, no problems with respiration noted. BREASTS: Symmetric in size. No masses, skin changes, nipple drainage, or lymphadenopathy. ABDOMEN: Soft, no distention noted.  No tenderness, rebound or guarding.  PELVIC: Normal appearing external genitalia; normal appearing vaginal mucosa and cervix.  No abnormal discharge noted.  Pap smear obtained.  Normal uterine size, no other palpable masses, no uterine or adnexal tenderness. MUSCULOSKELETAL: Normal range of motion. No tenderness.  No cyanosis, clubbing, or edema.    Assessment and Plan:  1. Encounter for annual routine gynecological examination Normal BP Normal weight Nonsmoker Advised if wanting to be sexually active, plan in advance for contraception to be active before beginning intercourse.  - Cytology - PAP( [place])  2. Vaginal odor Testing to see if there is any infection.  - Cervicovaginal  ancillary only( [place])  Will follow up results of pap smear and manage accordingly. Routine preventative health maintenance measures emphasized. Please refer to After Visit Summary for other counseling recommendations.    Nolene Bernheim, RN, MSN, NP-BC Nurse Practitioner, [facility]

## 2021-04-04 LAB — CERVICOVAGINAL ANCILLARY ONLY
Bacterial Vaginitis (gardnerella): POSITIVE — AB
Candida Glabrata: NEGATIVE
Candida Vaginitis: POSITIVE — AB
Comment: NEGATIVE
Comment: NEGATIVE
Comment: NEGATIVE

## 2021-04-04 LAB — CYTOLOGY - PAP: Diagnosis: NEGATIVE

## 2021-04-05 MED ORDER — TERCONAZOLE 0.4 % VA CREA: 1.0000 | TOPICAL_CREAM | Freq: Every day | VAGINAL | 1 refills | Status: AC

## 2021-04-05 MED ORDER — METRONIDAZOLE 500 MG PO TABS: 500.0000 mg | ORAL_TABLET | Freq: Two times a day (BID) | ORAL | 0 refills | Status: DC

## 2021-04-05 NOTE — Addendum Note (Signed)
Addended by: Currie Paris on: 04/05/2021 07:42 AM   Modules accepted: Orders

## 2022-03-18 ENCOUNTER — Other Ambulatory Visit: Payer: Self-pay

## 2022-03-18 ENCOUNTER — Ambulatory Visit (HOSPITAL_COMMUNITY)
Admission: EM | Admit: 2022-03-18 | Discharge: 2022-03-18 | Disposition: A | Discharge status: Home / Self Care | Payer: BLUE CROSS/BLUE SHIELD | Attending: Internal Medicine | Admitting: Internal Medicine

## 2022-03-18 ENCOUNTER — Encounter (HOSPITAL_COMMUNITY): Payer: Self-pay | Admitting: Emergency Medicine

## 2022-03-18 DIAGNOSIS — L308 Other specified dermatitis: Secondary | ICD-10-CM | POA: Diagnosis not present

## 2022-03-18 HISTORY — DX: Dermatitis, unspecified: L30.9

## 2022-03-18 MED ORDER — TRIAMCINOLONE ACETONIDE 0.1 % EX CREA: 1.0000 "application " | TOPICAL_CREAM | Freq: Two times a day (BID) | CUTANEOUS | 0 refills | Status: DC

## 2022-03-18 NOTE — Discharge Instructions (Signed)
You have been prescribed a steroid cream to help alleviate your eczema.  Please follow-up if symptoms persist or worsen. ?

## 2022-03-18 NOTE — ED Provider Notes (Signed)
?[facility] ? ? ? ?CSN: [id number] ?Arrival date & time: 03/18/22  1533 ? ? ?  ? ?History   ?Chief Complaint ?Chief Complaint  ?Patient presents with  ? Eczema  ? ? ?HPI ?Jaclyn Delgado is a 26 y.o. female.  ? ?Patient presents with a rash to left inner elbow, left lower leg, right arm that started a few days ago.  Patient reports that she has a history of eczema and this is consistent with that.  Rash is itchy.  Denies any changes in lotions, soaps, detergents, foods, etc.  Denies any fevers. ? ? ? ?Past Medical History:  ?Diagnosis Date  ? Eczema   ? Medical history non-contributory   ? ? ?Patient Active Problem List  ? Diagnosis Date Noted  ? History of COVID-19 03/23/2020  ? Abnormal Papanicolaou smear of cervix 02/20/2018  ? ? ?Past Surgical History:  ?Procedure Laterality Date  ? NO PAST SURGERIES    ? ? ?OB History   ? ? Gravida  ?5  ? Para  ?3  ? Term  ?3  ? Preterm  ?0  ? AB  ?2  ? Living  ?3  ?  ? ? SAB  ?0  ? IAB  ?0  ? Ectopic  ?0  ? Multiple  ?0  ? Live Births  ?3  ?   ?  ?  ? ? ? ?Home Medications   ? ?Prior to Admission medications   ?Medication Sig Start Date End Date Taking? Authorizing Provider  ?triamcinolone cream (KENALOG) 0.1 % Apply 1 application. topically 2 (two) times daily. 03/18/22  Yes Gustavus Bryant, FNP  ?metroNIDAZOLE (FLAGYL) 500 MG tablet Take 1 tablet (500 mg total) by mouth 2 (two) times daily. No alcohol while taking this medication ?Patient not taking: Reported on 03/18/2022 04/05/21   Currie Paris, NP  ? ? ?Family History ?Family History  ?Problem Relation Age of Onset  ? Renal Disease Mother   ? Bipolar disorder Mother   ? Hypertension Mother   ? ? ?Social History ?Social History  ? ?Tobacco Use  ? Smoking status: Never  ? Smokeless tobacco: Never  ?Vaping Use  ? Vaping Use: Never used  ?Substance Use Topics  ? Alcohol use: Not Currently  ?  Comment: socially  ? Drug use: No  ? ? ? ?Allergies   ?Patient has no known allergies. ? ? ?Review of Systems ?Review of  Systems ?Per HPI ? ?Physical Exam ?Triage Vital Signs ?ED Triage Vitals  ?Enc Vitals Group  ?   BP 03/18/22 1640 108/74  ?   Pulse Rate 03/18/22 1640 89  ?   Resp 03/18/22 1640 16  ?   Temp 03/18/22 1640 98.3 ?F (36.8 ?C)  ?   Temp Source 03/18/22 1640 Oral  ?   SpO2 03/18/22 1640 98 %  ?   Weight --   ?   Height --   ?   Head Circumference --   ?   Peak Flow --   ?   Pain Score 03/18/22 1636 0  ?   Pain Loc --   ?   Pain Edu? --   ?   Excl. in GC? --   ? ?No data found. ? ?Updated Vital Signs ?BP 108/74 (BP Location: Right Arm)   Pulse 89   Temp 98.3 ?F (36.8 ?C) (Oral)   Resp 16   LMP 02/17/2022 (Approximate)   SpO2 98%  ? ?Visual Acuity ?Right Eye Distance:   ?  Left Eye Distance:   ?Bilateral Distance:   ? ?Right Eye Near:   ?Left Eye Near:    ?Bilateral Near:    ? ?Physical Exam ?Constitutional:   ?   General: She is not in acute distress. ?   Appearance: Normal appearance. She is not toxic-appearing or diaphoretic.  ?HENT:  ?   Head: Normocephalic and atraumatic.  ?Eyes:  ?   Extraocular Movements: Extraocular movements intact.  ?   Conjunctiva/sclera: Conjunctivae normal.  ?Pulmonary:  ?   Effort: Pulmonary effort is normal.  ?Skin: ?   Comments: Scaly patches of skin to left inner elbow, left lower leg, right forearm.  No bacterial infection present.  ?Neurological:  ?   General: No focal deficit present.  ?   Mental Status: She is alert and oriented to person, place, and time. Mental status is at baseline.  ?Psychiatric:     ?   Mood and Affect: Mood normal.     ?   Behavior: Behavior normal.     ?   Thought Content: Thought content normal.     ?   Judgment: Judgment normal.  ? ? ? ?UC Treatments / Results  ?Labs ?(all labs ordered are listed, but only abnormal results are displayed) ?Labs Reviewed - No data to display ? ?EKG ? ? ?Radiology ?No results found. ? ?Procedures ?Procedures (including critical care time) ? ?Medications Ordered in UC ?Medications - No data to display ? ?Initial Impression /  Assessment and Plan / UC Course  ?I have reviewed the triage vital signs and the nursing notes. ? ?Pertinent labs & imaging results that were available during my care of the patient were reviewed by me and considered in my medical decision making (see chart for details). ? ?  ? ?Rash is consistent with atopic dermatitis or eczema.  Will treat with triamcinolone cream.  No signs of bacterial infection on exam.  Discussed supportive care and symptom management for eczema.  Discussed strict return precautions.  Patient verbalized understanding and was agreeable with plan. ?Final Clinical Impressions(s) / UC Diagnoses  ? ?Final diagnoses:  ?Other eczema  ? ? ? ?Discharge Instructions   ? ?  ?You have been prescribed a steroid cream to help alleviate your eczema.  Please follow-up if symptoms persist or worsen. ? ? ? ?ED Prescriptions   ? ? Medication Sig Dispense Auth. Provider  ? triamcinolone cream (KENALOG) 0.1 % Apply 1 application. topically 2 (two) times daily. 453 g Ervin Knack E, [place]  ? ?  ? ?PDMP not reviewed this encounter. ?  ?Gustavus Bryant, [place] ?03/18/22 1654 ? ?

## 2022-03-18 NOTE — ED Triage Notes (Signed)
Patient reports she has an eczema outbreak and has no medications ?

## 2022-09-25 ENCOUNTER — Ambulatory Visit (HOSPITAL_COMMUNITY)
Admission: EM | Admit: 2022-09-25 | Discharge: 2022-09-25 | Disposition: A | Discharge status: Home / Self Care | Payer: BLUE CROSS/BLUE SHIELD | Attending: Emergency Medicine | Admitting: Emergency Medicine

## 2022-09-25 ENCOUNTER — Encounter (HOSPITAL_COMMUNITY): Payer: Self-pay

## 2022-09-25 DIAGNOSIS — J0100 Acute maxillary sinusitis, unspecified: Secondary | ICD-10-CM

## 2022-09-25 MED ORDER — AMOXICILLIN-POT CLAVULANATE 875-125 MG PO TABS: 1.0000 | ORAL_TABLET | Freq: Two times a day (BID) | ORAL | 0 refills | Status: DC

## 2022-09-25 NOTE — ED Triage Notes (Signed)
Pt c/o headache, pain and pressure under eyes, and stuffy nose x2 days. Denies taking OTC med.

## 2022-09-25 NOTE — ED Provider Notes (Signed)
[place]    CSN: [id number] Arrival date & time: 09/25/22  1213      History   Chief Complaint Chief Complaint  Patient presents with   Facial Pain    HPI Jaclyn Delgado is a 26 y.o. female.  Patient complaining of nasal congestion and sinus pressure x 11 days.  Patient states sinus pressure improved initially and now has worsened over the past 2 days.  Patient denies any fever.  Patient denies any other cold symptoms. Patient has taken Mucinex with no relief of symptoms.   HPI  Past Medical History:  Diagnosis Date   Eczema    Medical history non-contributory     Patient Active Problem List   Diagnosis Date Noted   History of COVID-19 03/23/2020   Abnormal Papanicolaou smear of cervix 02/20/2018    Past Surgical History:  Procedure Laterality Date   NO PAST SURGERIES      OB History     Gravida  5   Para  3   Term  3   Preterm  0   AB  2   Living  3      SAB  0   IAB  0   Ectopic  0   Multiple  0   Live Births  3            Home Medications    Prior to Admission medications   Medication Sig Start Date End Date Taking? Authorizing Provider  amoxicillin-clavulanate (AUGMENTIN) 875-125 MG tablet Take 1 tablet by mouth every 12 (twelve) hours. 09/25/22  Yes Flossie Dibble, NP    Family History Family History  Problem Relation Age of Onset   Renal Disease Mother    Bipolar disorder Mother    Hypertension Mother     Social History Social History   Tobacco Use   Smoking status: Never   Smokeless tobacco: Never  Vaping Use   Vaping Use: Never used  Substance Use Topics   Alcohol use: Not Currently    Comment: socially   Drug use: No     Allergies   Patient has no known allergies.   Review of Systems Review of Systems  Constitutional:  Negative for activity change, appetite change, chills, fatigue and fever.  HENT:  Positive for congestion, rhinorrhea, sinus pressure and sinus pain. Negative for  dental problem, ear discharge, ear pain, facial swelling, mouth sores, nosebleeds, postnasal drip, sneezing, sore throat, tinnitus and trouble swallowing.   Respiratory:  Negative for cough, chest tightness, shortness of breath and wheezing.   Cardiovascular: Negative.   Gastrointestinal: Negative.   Musculoskeletal:  Negative for myalgias.     Physical Exam Triage Vital Signs ED Triage Vitals  Enc Vitals Group     BP 09/25/22 1337 125/86     Pulse Rate 09/25/22 1337 90     Resp 09/25/22 1337 18     Temp 09/25/22 1337 98.3 F (36.8 C)     Temp Source 09/25/22 1337 Oral     SpO2 09/25/22 1337 99 %     Weight --      Height --      Head Circumference --      Peak Flow --      Pain Score 09/25/22 1338 7     Pain Loc --      Pain Edu? --      Excl. in [place]? --    No data found.  Updated Vital Signs BP 125/86 (  BP Location: Left Arm)   Pulse 90   Temp 98.3 F (36.8 C) (Oral)   Resp 18   LMP 09/18/2022   SpO2 99%       Physical Exam Vitals and nursing note reviewed.  HENT:     Right Ear: Hearing, tympanic membrane, ear canal and external ear normal.     Left Ear: Hearing, tympanic membrane, ear canal and external ear normal.     Nose: Congestion and rhinorrhea present. No nasal tenderness. Rhinorrhea is purulent.     Right Turbinates: Enlarged and swollen. Not pale.     Left Turbinates: Not enlarged, swollen or pale.     Right Sinus: Maxillary sinus tenderness present. No frontal sinus tenderness.     Left Sinus: No maxillary sinus tenderness or frontal sinus tenderness.     Mouth/Throat:     Lips: Pink.     Mouth: Mucous membranes are moist.     Pharynx: Oropharynx is clear. Uvula midline. No pharyngeal swelling, oropharyngeal exudate, posterior oropharyngeal erythema or uvula swelling.     Tonsils: No tonsillar exudate or tonsillar abscesses. 0 on the right. 0 on the left.  Cardiovascular:     Rate and Rhythm: Normal rate and regular rhythm.     Heart sounds: Normal  heart sounds, S1 normal and S2 normal.  Pulmonary:     Effort: Pulmonary effort is normal.     Breath sounds: Normal breath sounds and air entry. No decreased breath sounds, wheezing, rhonchi or rales.  Lymphadenopathy:     Cervical: No cervical adenopathy.  Neurological:     Mental Status: She is alert.  Psychiatric:        Behavior: Behavior is cooperative.      UC Treatments / Results  Labs (all labs ordered are listed, but only abnormal results are displayed) Labs Reviewed - No data to display  EKG   Radiology No results found.  Procedures Procedures (including critical care time)  Medications Ordered in UC Medications - No data to display  Initial Impression / Assessment and Plan / UC Course  I have reviewed the triage vital signs and the nursing notes.  Pertinent labs & imaging results that were available during my care of the patient were reviewed by me and considered in my medical decision making (see chart for details).     Patient was diagnosed with acute maxillary sinusitis.  Augmentin was sent to the pharmacy, she was educated on medication regiment.  Patient was made aware of the timeframe for symptom resolution.  Patient verbalized understanding of instructions.  Final Clinical Impressions(s) / UC Diagnoses   Final diagnoses:  Acute non-recurrent maxillary sinusitis     Discharge Instructions      Augmentin was sent to the pharmacy, take this 2 times daily for the next 7 days.  As discussed, it is best that you have some food on your stomach before you take this medication to avoid stomach upset.  Please take all the antibiotics even if your symptoms improve.  You may follow-up with this clinic if symptoms do not improve within 5 days.       ED Prescriptions     Medication Sig Dispense Auth. Provider   amoxicillin-clavulanate (AUGMENTIN) 875-125 MG tablet Take 1 tablet by mouth every 12 (twelve) hours. 14 tablet Flossie Dibble, NP       PDMP not reviewed this encounter.   Flossie Dibble, NP 09/25/22 2201

## 2022-09-25 NOTE — Discharge Instructions (Addendum)
Augmentin was sent to the pharmacy, take this 2 times daily for the next 7 days.  As discussed, it is best that you have some food on your stomach before you take this medication to avoid stomach upset.  Please take all the antibiotics even if your symptoms improve.  You may follow-up with this clinic if symptoms do not improve within 5 days.

## 2023-02-19 ENCOUNTER — Ambulatory Visit (HOSPITAL_COMMUNITY)
Admission: EM | Admit: 2023-02-19 | Discharge: 2023-02-19 | Disposition: A | Discharge status: Home / Self Care | Payer: Self-pay | Attending: Family Medicine | Admitting: Family Medicine

## 2023-02-19 ENCOUNTER — Encounter (HOSPITAL_COMMUNITY): Payer: Self-pay

## 2023-02-19 DIAGNOSIS — J0100 Acute maxillary sinusitis, unspecified: Secondary | ICD-10-CM

## 2023-02-19 MED ORDER — AMOXICILLIN 400 MG/5ML PO SUSR: 800.0000 mg | Freq: Two times a day (BID) | ORAL | 0 refills | Status: AC

## 2023-02-19 MED ORDER — FLUTICASONE PROPIONATE 50 MCG/ACT NA SUSP: 2.0000 | Freq: Every day | NASAL | 0 refills | Status: DC

## 2023-02-19 NOTE — ED Provider Notes (Signed)
[place]    CSN: [id number] Arrival date & time: 02/19/23  1608      History   Chief Complaint Chief Complaint  Patient presents with   Nasal Congestion    HPI JESHA BLOSSOM is a 27 y.o. female.   HPI Here for 2-week history of nasal congestion.  She also now has some pain and pressure in her left cheek.  No fever at any point.  No cough.  Left ear does hurt a little bit.  Last menstrual cycle was March 3  Past Medical History:  Diagnosis Date   Eczema    Medical history non-contributory     Patient Active Problem List   Diagnosis Date Noted   History of COVID-19 03/23/2020   Abnormal Papanicolaou smear of cervix 02/20/2018    Past Surgical History:  Procedure Laterality Date   NO PAST SURGERIES      OB History     Gravida  5   Para  3   Term  3   Preterm  0   AB  2   Living  3      SAB  0   IAB  0   Ectopic  0   Multiple  0   Live Births  3            Home Medications    Prior to Admission medications   Medication Sig Start Date End Date Taking? Authorizing Provider  amoxicillin (AMOXIL) 400 MG/5ML suspension Take 10 mLs (800 mg total) by mouth 2 (two) times daily for 7 days. 02/19/23 02/26/23 Yes Barrett Henle, MD  fluticasone (FLONASE) 50 MCG/ACT nasal spray Place 2 sprays into both nostrils daily. 02/19/23  Yes Jerolene Kupfer, Gwenlyn Perking, MD    Family History Family History  Problem Relation Age of Onset   Renal Disease Mother    Bipolar disorder Mother    Hypertension Mother     Social History Social History   Tobacco Use   Smoking status: Never   Smokeless tobacco: Never  Vaping Use   Vaping Use: Never used  Substance Use Topics   Alcohol use: Not Currently    Comment: socially   Drug use: No     Allergies   Patient has no known allergies.   Review of Systems Review of Systems   Physical Exam Triage Vital Signs ED Triage Vitals  Enc Vitals Group     BP 02/19/23 1721 118/78     Pulse  Rate 02/19/23 1721 83     Resp 02/19/23 1721 16     Temp 02/19/23 1721 98.1 F (36.7 C)     Temp Source 02/19/23 1721 Oral     SpO2 02/19/23 1721 97 %     Weight --      Height --      Head Circumference --      Peak Flow --      Pain Score 02/19/23 1723 5     Pain Loc --      Pain Edu? --      Excl. in [place]? --    No data found.  Updated Vital Signs BP 118/78 (BP Location: Right Arm)   Pulse 83   Temp 98.1 F (36.7 C) (Oral)   Resp 16   LMP 01/27/2023 (Exact Date)   SpO2 97%   Visual Acuity Right Eye Distance:   Left Eye Distance:   Bilateral Distance:    Right Eye Near:   Left Eye  Near:    Bilateral Near:     Physical Exam Vitals reviewed.  Constitutional:      General: She is not in acute distress.    Appearance: She is not toxic-appearing.  HENT:     Right Ear: Tympanic membrane and ear canal normal.     Left Ear: Tympanic membrane and ear canal normal.     Nose: Congestion present.     Mouth/Throat:     Mouth: Mucous membranes are moist.     Pharynx: No oropharyngeal exudate or posterior oropharyngeal erythema.  Eyes:     Extraocular Movements: Extraocular movements intact.     Conjunctiva/sclera: Conjunctivae normal.     Pupils: Pupils are equal, round, and reactive to light.  Cardiovascular:     Rate and Rhythm: Normal rate and regular rhythm.     Heart sounds: No murmur heard. Pulmonary:     Effort: Pulmonary effort is normal. No respiratory distress.     Breath sounds: No stridor. No wheezing, rhonchi or rales.  Musculoskeletal:     Cervical back: Neck supple.  Lymphadenopathy:     Cervical: No cervical adenopathy.  Skin:    Capillary Refill: Capillary refill takes less than 2 seconds.     Coloration: Skin is not jaundiced or pale.  Neurological:     General: No focal deficit present.     Mental Status: She is alert and oriented to person, place, and time.  Psychiatric:        Behavior: Behavior normal.      UC Treatments / Results   Labs (all labs ordered are listed, but only abnormal results are displayed) Labs Reviewed - No data to display  EKG   Radiology No results found.  Procedures Procedures (including critical care time)  Medications Ordered in UC Medications - No data to display  Initial Impression / Assessment and Plan / UC Course  I have reviewed the triage vital signs and the nursing notes.  Pertinent labs & imaging results that were available during my care of the patient were reviewed by me and considered in my medical decision making (see chart for details).        I am going to treat for sinusitis since she has sinus pressure, long length of symptoms, and double sickening  She requested liquid antibiotics as she has a hard time taking pills Final Clinical Impressions(s) / UC Diagnoses   Final diagnoses:  Acute maxillary sinusitis, recurrence not specified     Discharge Instructions      Amoxicillin 400 mg / 5 mL--take 10 mL by mouth 2 times daily for 7 days.  Fluticasone/Flonase nose spray--put 2 sprays in each nostril once daily       ED Prescriptions     Medication Sig Dispense Auth. Provider   amoxicillin (AMOXIL) 400 MG/5ML suspension Take 10 mLs (800 mg total) by mouth 2 (two) times daily for 7 days. 140 mL Barrett Henle, MD   fluticasone [facility]) 50 MCG/ACT nasal spray Place 2 sprays into both nostrils daily. 16 g Barrett Henle, MD      PDMP not reviewed this encounter.   Barrett Henle, MD 02/19/23 [phone]

## 2023-02-19 NOTE — Discharge Instructions (Signed)
Amoxicillin 400 mg / 5 mL--take 10 mL by mouth 2 times daily for 7 days.  Fluticasone/Flonase nose spray--put 2 sprays in each nostril once daily

## 2023-02-19 NOTE — ED Triage Notes (Signed)
Pt states congestion, headache and pain to her face for the past 2 weeks.  Has not been taking anything at home for her symptoms.

## 2023-11-15 ENCOUNTER — Encounter (HOSPITAL_COMMUNITY): Payer: Self-pay | Admitting: Emergency Medicine

## 2023-11-15 ENCOUNTER — Ambulatory Visit (HOSPITAL_COMMUNITY)
Admission: EM | Admit: 2023-11-15 | Discharge: 2023-11-15 | Disposition: A | Discharge status: Home / Self Care | Payer: Self-pay | Attending: Internal Medicine | Admitting: Internal Medicine

## 2023-11-15 DIAGNOSIS — J029 Acute pharyngitis, unspecified: Secondary | ICD-10-CM | POA: Insufficient documentation

## 2023-11-15 LAB — POCT RAPID STREP A (OFFICE): Rapid Strep A Screen: NEGATIVE

## 2023-11-15 NOTE — ED Triage Notes (Signed)
Pt c/o sore throat and right ear pain that began Wednesday.   States daughter dx with strep recently

## 2023-11-15 NOTE — ED Provider Notes (Signed)
[facility]    CSN: [id number] Arrival date & time: 11/15/23  1714      History   Chief Complaint Chief Complaint  Patient presents with   Sore Throat    HPI Jaclyn Delgado is a 27 y.o. female.   Patient presents to clinic complaining of sore throat that started on Wednesday.  She has 3 daughters and all 3 are currently getting treated for strep throat.  She has been taking NyQuil at night.  She has not had any fevers.  She has a little congestion.  No cough.  No vomiting or diarrhea.  Endorses pain with swallowing.  The history is provided by the patient and medical records.  Sore Throat    Past Medical History:  Diagnosis Date   Eczema    Medical history non-contributory     Patient Active Problem List   Diagnosis Date Noted   History of COVID-19 03/23/2020   Abnormal Papanicolaou smear of cervix 02/20/2018    Past Surgical History:  Procedure Laterality Date   NO PAST SURGERIES      OB History     Gravida  5   Para  3   Term  3   Preterm  0   AB  2   Living  3      SAB  0   IAB  0   Ectopic  0   Multiple  0   Live Births  3            Home Medications    Prior to Admission medications   Medication Sig Start Date End Date Taking? Authorizing Provider  fluticasone (FLONASE) 50 MCG/ACT nasal spray Place 2 sprays into both nostrils daily. 02/19/23   Zenia Resides, MD    Family History Family History  Problem Relation Age of Onset   Renal Disease Mother    Bipolar disorder Mother    Hypertension Mother     Social History Social History   Tobacco Use   Smoking status: Never   Smokeless tobacco: Never  Vaping Use   Vaping status: Never Used  Substance Use Topics   Alcohol use: Not Currently    Comment: socially   Drug use: No     Allergies   Patient has no known allergies.   Review of Systems Review of Systems  Per HPI   Physical Exam Triage Vital Signs ED Triage Vitals  Encounter  Vitals Group     BP 11/15/23 1753 125/83     Systolic BP Percentile --      Diastolic BP Percentile --      Pulse Rate 11/15/23 1753 77     Resp 11/15/23 1753 17     Temp 11/15/23 1753 98.1 F (36.7 C)     Temp Source 11/15/23 1753 Oral     SpO2 11/15/23 1753 95 %     Weight --      Height --      Head Circumference --      Peak Flow --      Pain Score 11/15/23 1751 7     Pain Loc --      Pain Education --      Exclude from Growth Chart --    No data found.  Updated Vital Signs BP 125/83 (BP Location: Left Arm)   Pulse 77   Temp 98.1 F (36.7 C) (Oral)   Resp 17   LMP 10/22/2023 (Exact Date)   SpO2 95%  Visual Acuity Right Eye Distance:   Left Eye Distance:   Bilateral Distance:    Right Eye Near:   Left Eye Near:    Bilateral Near:     Physical Exam Vitals and nursing note reviewed.  Constitutional:      Appearance: Normal appearance. She is well-developed.  HENT:     Head: Normocephalic and atraumatic.     Right Ear: External ear normal.     Left Ear: External ear normal.     Nose: No congestion or rhinorrhea.     Mouth/Throat:     Mouth: Mucous membranes are moist.     Pharynx: Uvula midline. Posterior oropharyngeal erythema present.     Tonsils: No tonsillar exudate or tonsillar abscesses. 1+ on the right. 1+ on the left.  Eyes:     Conjunctiva/sclera: Conjunctivae normal.  Cardiovascular:     Rate and Rhythm: Normal rate.  Pulmonary:     Effort: Pulmonary effort is normal. No respiratory distress.  Musculoskeletal:        General: Normal range of motion.  Lymphadenopathy:     Cervical: Cervical adenopathy present.  Skin:    General: Skin is warm and dry.  Neurological:     General: No focal deficit present.     Mental Status: She is alert and oriented to person, place, and time.  Psychiatric:        Mood and Affect: Mood normal.        Behavior: Behavior normal.      UC Treatments / Results  Labs (all labs ordered are listed, but only  abnormal results are displayed) Labs Reviewed  CULTURE, GROUP A STREP [facility])  POCT RAPID STREP A (OFFICE)    EKG   Radiology No results found.  Procedures Procedures (including critical care time)  Medications Ordered in UC Medications - No data to display  Initial Impression / Assessment and Plan / UC Course  I have reviewed the triage vital signs and the nursing notes.  Pertinent labs & imaging results that were available during my care of the patient were reviewed by me and considered in my medical decision making (see chart for details).  Vitals and triage reviewed, patient is hemodynamically stable.  Posterior pharynx with slight erythema, uvula midline, low concern for PTA.  She does have postnasal drip.  Rapid strep is negative, will send for culture.  Symptomatic management encouraged.  Plan of care, follow-up care return precautions given, no questions at this time.     Final Clinical Impressions(s) / UC Diagnoses   Final diagnoses:  Acute pharyngitis, unspecified etiology     Discharge Instructions      Your rapid strep is negative, her sending this off for culture and we will contact you if antibiotics are indicated.  In the meantime you can do warm saline gargles, drink tea with honey and cold foods like popsicles.  Alternate between 800 mg of ibuprofen and 500 mg of Tylenol every 4-6 hours for any aches, pains or fever.    Return to clinic if no improvement over the next 7 days, or you develop any new concerning symptoms.      ED Prescriptions   None    PDMP not reviewed this encounter.   Tyquisha Sharps, [place], [place] 11/15/23 [phone]

## 2023-11-15 NOTE — Discharge Instructions (Addendum)
Your rapid strep is negative, her sending this off for culture and we will contact you if antibiotics are indicated.  In the meantime you can do warm saline gargles, drink tea with honey and cold foods like popsicles.  Alternate between 800 mg of ibuprofen and 500 mg of Tylenol every 4-6 hours for any aches, pains or fever.    Return to clinic if no improvement over the next 7 days, or you develop any new concerning symptoms.

## 2023-11-18 ENCOUNTER — Telehealth (HOSPITAL_COMMUNITY): Payer: Self-pay

## 2023-11-18 LAB — CULTURE, GROUP A STREP (THRC)

## 2023-11-18 MED ORDER — AMOXICILLIN 400 MG/5ML PO SUSR: 500.0000 mg | Freq: Two times a day (BID) | ORAL | 0 refills | Status: AC

## 2023-11-18 NOTE — Telephone Encounter (Signed)
TC to pt, who reports continued symptoms.  Per protocol, pt to be treated with Amoxicillin. Pt requested liquid. Reviewed with patient, verified pharmacy, prescription sent.

## 2024-06-25 ENCOUNTER — Ambulatory Visit (HOSPITAL_COMMUNITY)
Admission: EM | Admit: 2024-06-25 | Discharge: 2024-06-25 | Disposition: A | Discharge status: Home / Self Care | Payer: Self-pay | Attending: Emergency Medicine | Admitting: Emergency Medicine

## 2024-06-25 ENCOUNTER — Encounter (HOSPITAL_COMMUNITY): Payer: Self-pay

## 2024-06-25 DIAGNOSIS — S80861A Insect bite (nonvenomous), right lower leg, initial encounter: Secondary | ICD-10-CM

## 2024-06-25 DIAGNOSIS — W57XXXA Bitten or stung by nonvenomous insect and other nonvenomous arthropods, initial encounter: Secondary | ICD-10-CM

## 2024-06-25 DIAGNOSIS — H9201 Otalgia, right ear: Secondary | ICD-10-CM

## 2024-06-25 DIAGNOSIS — L03115 Cellulitis of right lower limb: Secondary | ICD-10-CM

## 2024-06-25 MED ORDER — TRIAMCINOLONE ACETONIDE 0.1 % EX CREA: 1.0000 | TOPICAL_CREAM | Freq: Two times a day (BID) | CUTANEOUS | 0 refills | Status: DC

## 2024-06-25 MED ORDER — CEPHALEXIN 250 MG/5ML PO SUSR: 500.0000 mg | Freq: Two times a day (BID) | ORAL | 0 refills | Status: AC

## 2024-06-25 NOTE — ED Provider Notes (Signed)
 [facility]    CSN: [id number] Arrival date & time: 06/25/24  9163      History   Chief Complaint Chief Complaint  Patient presents with   Insect Bite   Otalgia    HPI Jaclyn Delgado is a 28 y.o. female.  5 days ago bit by a bug on her right calf. First was itching and she has scratched it a lot.  3 days ago it started to become swollen, painful, red and warm.  She has been applying lotion.  Also having 2 days of right ear pain and pressure No fever. No congestion or cough  No meds yet  Past Medical History:  Diagnosis Date   Eczema    Medical history non-contributory     Patient Active Problem List   Diagnosis Date Noted   History of COVID-19 03/23/2020   Abnormal Papanicolaou smear of cervix 02/20/2018    Past Surgical History:  Procedure Laterality Date   NO PAST SURGERIES      OB History     Gravida  5   Para  3   Term  3   Preterm  0   AB  2   Living  3      SAB  0   IAB  0   Ectopic  0   Multiple  0   Live Births  3            Home Medications    Prior to Admission medications   Medication Sig Start Date End Date Taking? Authorizing Provider  cephALEXin  (KEFLEX ) 250 MG/5ML suspension Take 10 mLs (500 mg total) by mouth 2 (two) times daily for 7 days. 06/25/24 07/02/24 Yes Patrice Matthew, [place], PA-C  triamcinolone  cream (KENALOG ) 0.1 % Apply 1 Application topically 2 (two) times daily. 06/25/24  Yes Jenson Beedle, [place], PA-C    Family History Family History  Problem Relation Age of Onset   Renal Disease Mother    Bipolar disorder Mother    Hypertension Mother     Social History Social History   Tobacco Use   Smoking status: Never   Smokeless tobacco: Never  Vaping Use   Vaping status: Never Used  Substance Use Topics   Alcohol use: Not Currently    Comment: socially   Drug use: No     Allergies   Patient has no known allergies.   Review of Systems Review of Systems As per HPI  Physical Exam Triage  Vital Signs ED Triage Vitals  Encounter Vitals Group     BP 06/25/24 0905 120/80     Girls Systolic BP Percentile --      Girls Diastolic BP Percentile --      Boys Systolic BP Percentile --      Boys Diastolic BP Percentile --      Pulse Rate 06/25/24 0905 66     Resp 06/25/24 0905 14     Temp 06/25/24 0905 98.2 F (36.8 C)     Temp Source 06/25/24 0905 Oral     SpO2 06/25/24 0905 95 %     Weight --      Height --      Head Circumference --      Peak Flow --      Pain Score 06/25/24 0904 5     Pain Loc --      Pain Education --      Exclude from Growth Chart --    No data found.  Updated Vital  Signs BP 120/80 (BP Location: Right Arm)   Pulse 66   Temp 98.2 F (36.8 C) (Oral)   Resp 14   LMP 06/01/2024   SpO2 95%      Physical Exam Vitals and nursing note reviewed.  Constitutional:      General: She is not in acute distress.    Appearance: Normal appearance.  HENT:     Right Ear: Tympanic membrane, ear canal and external ear normal.     Left Ear: Tympanic membrane and ear canal normal.     Mouth/Throat:     Mouth: Mucous membranes are moist.     Pharynx: Oropharynx is clear.  Eyes:     Conjunctiva/sclera: Conjunctivae normal.  Cardiovascular:     Rate and Rhythm: Normal rate and regular rhythm.     Pulses: Normal pulses.     Heart sounds: Normal heart sounds.  Pulmonary:     Effort: Pulmonary effort is normal.     Breath sounds: Normal breath sounds.  Musculoskeletal:        General: Normal range of motion.     Cervical back: Normal range of motion.  Skin:    Findings: Erythema present.         Comments: Excoriated skin on the right calf. Central area of erythema, localized swelling, tenderness.   Neurological:     Mental Status: She is alert and oriented to person, place, and time.     Comments: Distal sensation intact. DP pulses 2+     UC Treatments / Results  Labs (all labs ordered are listed, but only abnormal results are displayed) Labs  Reviewed - No data to display  EKG  Radiology No results found.  Procedures Procedures (including critical care time)  Medications Ordered in UC Medications - No data to display  Initial Impression / Assessment and Plan / UC Course  I have reviewed the triage vital signs and the nursing notes.  Pertinent labs & imaging results that were available during my care of the patient were reviewed by me and considered in my medical decision making (see chart for details).  Insect bite of the right calf, whole calf is very excoriated.  There is a central area of erythema and induration about the size of a quarter. Cover for bacterial skin infection with keflex  BID x 7 days.  Patient reports she cannot swallow pills so the suspension is prescribed.  Recommend keeping the area clean.  I have also sent Kenalog  to use twice daily for itching.  Advised further scratching can worsen the infection.  Right otalgia No abnormality on exam.  Recommend supportive care with Tylenol  and ibuprofen .  Can also use allergy med and nasal spray if helpful.  Can return if needed A note for work is requested and provided  Final Clinical Impressions(s) / UC Diagnoses   Final diagnoses:  Cellulitis of leg, right  Insect bite of right lower leg, initial encounter  Right ear pain     Discharge Instructions      Keflex  - antibiotic twice daily for 7 days. Take with food to avoid upset stomach. Finish full course  Kenalog  cream - anti-itch. Apply twice daily for 1-2 weeks  Continue to keep the area clean. Wash with soap and water. Avoid further itching.  For ear pain, I recommend ibuprofen  or tylenol       ED Prescriptions     Medication Sig Dispense Auth. Provider   cephALEXin  (KEFLEX ) 250 MG/5ML suspension Take 10 mLs (500 mg total)  by mouth 2 (two) times daily for 7 days. 140 mL Shaiden Aldous, PA-C   triamcinolone  cream (KENALOG ) 0.1 % Apply 1 Application topically 2 (two) times daily. 30 g  Doss Cybulski, [place], PA-C      PDMP not reviewed this encounter.   Jeryl [place], PA-C 06/25/24 9062

## 2024-06-25 NOTE — Discharge Instructions (Signed)
 Keflex  - antibiotic twice daily for 7 days. Take with food to avoid upset stomach. Finish full course  Kenalog  cream - anti-itch. Apply twice daily for 1-2 weeks  Continue to keep the area clean. Wash with soap and water. Avoid further itching.  For ear pain, I recommend ibuprofen  or tylenol 

## 2024-06-25 NOTE — ED Triage Notes (Addendum)
 Patient reports that she was bitten by a bug 5 days below the right calf. Patient states the right calf began swelling and itching 3 days ago.  Patient states she has been using hand lotion to the area.   Patient added afater triage that she was having right ear pain/pressure x 2 days.  Patient has not used any medication for her symptoms.

## 2024-10-31 ENCOUNTER — Ambulatory Visit (HOSPITAL_COMMUNITY)
Admission: EM | Admit: 2024-10-31 | Discharge: 2024-10-31 | Disposition: A | Discharge status: Home / Self Care | Payer: Self-pay

## 2024-10-31 ENCOUNTER — Encounter (HOSPITAL_COMMUNITY): Payer: Self-pay | Admitting: Emergency Medicine

## 2024-10-31 DIAGNOSIS — J00 Acute nasopharyngitis [common cold]: Secondary | ICD-10-CM

## 2024-10-31 DIAGNOSIS — L2089 Other atopic dermatitis: Secondary | ICD-10-CM

## 2024-10-31 LAB — POCT RAPID STREP A (OFFICE): Rapid Strep A Screen: NEGATIVE

## 2024-10-31 MED ORDER — TRIAMCINOLONE ACETONIDE 0.1 % EX CREA: 1.0000 | TOPICAL_CREAM | Freq: Two times a day (BID) | CUTANEOUS | 0 refills | Status: AC

## 2024-10-31 NOTE — ED Triage Notes (Signed)
 Patient has had a sore throat for two days, Patient has a rash on the both sides of her neck.  Patient has taken tylenol  for her throat yesterday.

## 2024-10-31 NOTE — Discharge Instructions (Signed)
 Your triamcinolone  cream was refilled today. Apply a thin layer twice daily as needed for eczema   What is Nasopharyngitis? Nasopharyngitis, commonly known as the common cold, is a viral infection affecting the nose and throat (upper respiratory tract). It is usually caused by rhinoviruses and other respiratory viruses. Symptoms typically include nasal congestion, runny nose, sore throat, sneezing, mild cough, and sometimes low-grade fever or malaise. The illness is self-limited and generally resolves within 7-10 days. Key Points for Patient Education: Cause: Viral, not bacterial--antibiotics are not effective. Transmission: Spread by respiratory droplets (coughing, sneezing) and contact with contaminated surfaces. Course: Symptoms usually peak within 2-3 days and gradually improve. Most people recover without complications.  Symptomatic Management General Measures: Rest: Encourage adequate rest to support recovery. Hydration: Drink plenty of fluids (water, clear broths, herbal teas) to stay hydrated and help thin mucus. Nutrition: Eat a balanced diet as tolerated. Symptom Relief: Nasal Congestion/Runny Nose: Saline nasal irrigation or sprays can help clear nasal passages. Humidified air (cool-mist humidifier or steamy shower) may ease congestion. Sore Throat: Warm saltwater gargles (1/2 tsp salt in 8 oz warm water) several times daily. Throat lozenges or hard candies for temporary relief (avoid in young children due to choking risk). Cough: Honey (for patients >20 year old) can soothe cough. Cough drops or warm fluids may help. Fever/Discomfort: Acetaminophen  or ibuprofen  as needed for fever, headache, or body aches (use age-appropriate dosing). What to Avoid: Antibiotics: Not indicated for viral infections. Decongestants and cough/cold medications: Use with caution, especially in children and older adults, due to potential side effects. Not recommended for children under 6  years. Smoking and secondhand smoke: Can worsen symptoms and delay recovery. When to Seek Further Evaluation: Symptoms lasting >10 days without improvement. High fever (>102F/39C) or persistent fever. Shortness of breath, chest pain, severe headache, neck stiffness, or confusion. Ear pain, facial pain/swelling, or purulent nasal discharge persisting beyond 7-10 days (may suggest bacterial sinusitis or other complications). Worsening symptoms after initial improvement.  Prevention Tips: Wash hands frequently with soap and water. Avoid close contact with sick individuals. Cover mouth and nose when coughing or sneezing. Disinfect commonly touched surfaces.

## 2024-10-31 NOTE — ED Provider Notes (Signed)
 [facility]    CSN: [id number] Arrival date & time: 10/31/24  9063      History   Chief Complaint Chief Complaint  Patient presents with   Sore Throat   Rash    HPI Jaclyn Delgado is a 28 y.o. female.   Jaclyn Delgado presents today with complaint of sore throat that began 2 days ago.  She reports that throat feels scratchy and swollen with significant postnasal drainage.  She also reports that she has had mild bodyaches and nasal congestion.  She denies fevers, chills, headache, ear pain, cough, shortness of breath, wheezing, nausea, vomiting, and diarrhea.  She denies known sick exposures.  She has taken Tylenol  for symptoms.  She also reports that she is having an eczema flare on her right neck.  This began last week, but she is out of her triamcinolone  cream.  She reports that eczema is itchy and dry  The history is provided by the patient.  Sore Throat Pertinent negatives include no headaches and no shortness of breath.  Rash Associated symptoms: sore throat   Associated symptoms: no diarrhea, no fatigue, no headaches, no nausea, no shortness of breath, not vomiting and not wheezing     Past Medical History:  Diagnosis Date   Eczema    Medical history non-contributory     Patient Active Problem List   Diagnosis Date Noted   History of COVID-19 03/23/2020   Abnormal Papanicolaou smear of cervix 02/20/2018    Past Surgical History:  Procedure Laterality Date   NO PAST SURGERIES      OB History     Gravida  5   Para  3   Term  3   Preterm  0   AB  2   Living  3      SAB  0   IAB  0   Ectopic  0   Multiple  0   Live Births  3            Home Medications    Prior to Admission medications   Medication Sig Start Date End Date Taking? Authorizing Provider  triamcinolone  cream (KENALOG ) 0.1 % Apply 1 Application topically 2 (two) times daily. 10/31/24   Leatrice Vernell HERO, NP    Family History Family History  Problem  Relation Age of Onset   Renal Disease Mother    Bipolar disorder Mother    Hypertension Mother     Social History Social History   Tobacco Use   Smoking status: Never   Smokeless tobacco: Never  Vaping Use   Vaping status: Never Used  Substance Use Topics   Alcohol use: Not Currently    Comment: socially   Drug use: No     Allergies   Patient has no known allergies.   Review of Systems Review of Systems  Constitutional:  Negative for activity change, appetite change, chills, diaphoresis and fatigue.  HENT:  Positive for congestion, postnasal drip and sore throat. Negative for ear discharge, ear pain, rhinorrhea, sinus pressure and sinus pain.   Respiratory:  Negative for cough, shortness of breath and wheezing.   Gastrointestinal:  Negative for diarrhea, nausea and vomiting.  Skin:  Positive for rash.  Neurological:  Negative for headaches.     Physical Exam Triage Vital Signs ED Triage Vitals  Encounter Vitals Group     BP 10/31/24 0959 110/62     Girls Systolic BP Percentile --      Girls Diastolic BP  Percentile --      Boys Systolic BP Percentile --      Boys Diastolic BP Percentile --      Pulse Rate 10/31/24 0959 83     Resp 10/31/24 0959 16     Temp 10/31/24 0959 98.3 F (36.8 C)     Temp Source 10/31/24 0959 Oral     SpO2 10/31/24 0959 98 %     Weight --      Height --      Head Circumference --      Peak Flow --      Pain Score 10/31/24 0957 6     Pain Loc --      Pain Education --      Exclude from Growth Chart --    No data found.  Updated Vital Signs BP 110/62 (BP Location: Right Arm)   Pulse 83   Temp 98.3 F (36.8 C) (Oral)   Resp 16   LMP 10/12/2024 (Exact Date)   SpO2 98%   Visual Acuity Right Eye Distance:   Left Eye Distance:   Bilateral Distance:    Right Eye Near:   Left Eye Near:    Bilateral Near:     Physical Exam Vitals and nursing note reviewed.  Constitutional:      General: She is not in acute distress.     Appearance: Normal appearance. She is normal weight. She is not toxic-appearing.  HENT:     Head: Normocephalic.     Right Ear: Ear canal and external ear normal. A middle ear effusion is present. There is no impacted cerumen. No mastoid tenderness. No hemotympanum. Tympanic membrane is bulging. Tympanic membrane is not injected or erythematous.     Left Ear: Ear canal and external ear normal. A middle ear effusion is present. There is no impacted cerumen. No mastoid tenderness. No hemotympanum. Tympanic membrane is bulging. Tympanic membrane is not injected or erythematous.     Ears:     Comments: Serous fluid present behind bilat TM's with mild bulging     Nose: Congestion and rhinorrhea present.     Mouth/Throat:     Mouth: Mucous membranes are moist.     Pharynx: Oropharynx is clear. Posterior oropharyngeal erythema and postnasal drip present. No oropharyngeal exudate.     Tonsils: No tonsillar exudate or tonsillar abscesses. 1+ on the right. 1+ on the left.     Comments: Mild oropharynx erythema with posterior injection and post nasal drip  Eyes:     Conjunctiva/sclera: Conjunctivae normal.  Cardiovascular:     Rate and Rhythm: Normal rate and regular rhythm.     Heart sounds: Normal heart sounds.  Pulmonary:     Effort: Pulmonary effort is normal.     Breath sounds: Normal breath sounds.  Musculoskeletal:     Cervical back: Neck supple.  Lymphadenopathy:     Cervical: No cervical adenopathy.  Skin:    General: Skin is warm and dry.     Findings: Rash (Dry scaly atopic rash to right side of neck.) present.  Neurological:     Mental Status: She is alert and oriented to person, place, and time.  Psychiatric:        Mood and Affect: Mood normal.        Behavior: Behavior normal.      UC Treatments / Results  Labs (all labs ordered are listed, but only abnormal results are displayed) Labs Reviewed  POCT RAPID STREP A (OFFICE)  EKG   Radiology No results  found.  Procedures Procedures (including critical care time)  Medications Ordered in UC Medications - No data to display  Initial Impression / Assessment and Plan / UC Course  I have reviewed the triage vital signs and the nursing notes.  Pertinent labs & imaging results that were available during my care of the patient were reviewed by me and considered in my medical decision making (see chart for details).     Nasopharyngitis: Rapid strep test negative.  No evidence of bacterial infection on exam.  Recommend supportive management with rest, hydration, and symptomatic management with over-the-counter medications as needed. Topic dermatitis: Triamcinolone  refilled Final Clinical Impressions(s) / UC Diagnoses   Final diagnoses:  Nasopharyngitis  Other atopic dermatitis     Discharge Instructions      Your triamcinolone  cream was refilled today. Apply a thin layer twice daily as needed for eczema   What is Nasopharyngitis? Nasopharyngitis, commonly known as the common cold, is a viral infection affecting the nose and throat (upper respiratory tract). It is usually caused by rhinoviruses and other respiratory viruses. Symptoms typically include nasal congestion, runny nose, sore throat, sneezing, mild cough, and sometimes low-grade fever or malaise. The illness is self-limited and generally resolves within 7-10 days. Key Points for Patient Education: Cause: Viral, not bacterial--antibiotics are not effective. Transmission: Spread by respiratory droplets (coughing, sneezing) and contact with contaminated surfaces. Course: Symptoms usually peak within 2-3 days and gradually improve. Most people recover without complications.  Symptomatic Management General Measures: Rest: Encourage adequate rest to support recovery. Hydration: Drink plenty of fluids (water, clear broths, herbal teas) to stay hydrated and help thin mucus. Nutrition: Eat a balanced diet as tolerated. Symptom  Relief: Nasal Congestion/Runny Nose: Saline nasal irrigation or sprays can help clear nasal passages. Humidified air (cool-mist humidifier or steamy shower) may ease congestion. Sore Throat: Warm saltwater gargles (1/2 tsp salt in 8 oz warm water) several times daily. Throat lozenges or hard candies for temporary relief (avoid in young children due to choking risk). Cough: Honey (for patients >27 year old) can soothe cough. Cough drops or warm fluids may help. Fever/Discomfort: Acetaminophen  or ibuprofen  as needed for fever, headache, or body aches (use age-appropriate dosing). What to Avoid: Antibiotics: Not indicated for viral infections. Decongestants and cough/cold medications: Use with caution, especially in children and older adults, due to potential side effects. Not recommended for children under 6 years. Smoking and secondhand smoke: Can worsen symptoms and delay recovery. When to Seek Further Evaluation: Symptoms lasting >10 days without improvement. High fever (>102F/39C) or persistent fever. Shortness of breath, chest pain, severe headache, neck stiffness, or confusion. Ear pain, facial pain/swelling, or purulent nasal discharge persisting beyond 7-10 days (may suggest bacterial sinusitis or other complications). Worsening symptoms after initial improvement.  Prevention Tips: Wash hands frequently with soap and water. Avoid close contact with sick individuals. Cover mouth and nose when coughing or sneezing. Disinfect commonly touched surfaces.      ED Prescriptions     Medication Sig Dispense Auth. Provider   triamcinolone  cream (KENALOG ) 0.1 % Apply 1 Application topically 2 (two) times daily. 30 g Leatrice Vernell HERO, NP      PDMP not reviewed this encounter.   Leatrice Vernell HERO, NP 10/31/24 1120

## 2024-11-02 ENCOUNTER — Emergency Department (HOSPITAL_COMMUNITY)
Admission: EM | Admit: 2024-11-02 | Discharge: 2024-11-02 | Disposition: A | Discharge status: Home / Self Care | Payer: Self-pay

## 2024-11-02 ENCOUNTER — Encounter (HOSPITAL_COMMUNITY): Payer: Self-pay

## 2024-11-02 ENCOUNTER — Other Ambulatory Visit: Payer: Self-pay

## 2024-11-02 DIAGNOSIS — H1032 Unspecified acute conjunctivitis, left eye: Secondary | ICD-10-CM

## 2024-11-02 MED ORDER — ERYTHROMYCIN 5 MG/GM OP OINT
1.0000 | TOPICAL_OINTMENT | Freq: Three times a day (TID) | OPHTHALMIC | Status: DC
  Administered 2024-11-02: 1 via OPHTHALMIC
  Filled 2024-11-02: qty 3.5

## 2024-11-02 NOTE — Discharge Instructions (Signed)
 As we discussed, your exam suggests bacterial infection in the eye. Use the erythromycin  ointment 3 times a day for 5 days in the left eye. If you show symptoms in the right eye, start treating that as well as the infection can spread easily. Return to work after 24 hours on antibiotic.    Return to the ED if symptoms worsen - increased pain or swelling, vision changes or new concern.

## 2024-11-02 NOTE — ED Triage Notes (Signed)
 Pt presents with a painful and red eye on the L. Pt was driving when she wiped some crust out of the corner of her eye and then it became painful and red. Pt works at a daycare. Denies sick contacts. Pt does not wear contact lenses.

## 2024-11-02 NOTE — ED Provider Notes (Signed)
  [place] [facility] Provider Note   CSN: [id number] Arrival date & time: 11/02/24  2109     Patient presents with: Eye Problem   Jaclyn Delgado is a 28 y.o. female.   Patient to ED with redness and irritation to the left eye that started earlier today. She states symptoms started with crust build up on the eye followed by redness and pus drainage. No vision changes. No eye lid swelling. She works in a day care setting.  The history is provided by the patient. No language interpreter was used.  Eye Problem      Prior to Admission medications   Medication Sig Start Date End Date Taking? Authorizing Provider  triamcinolone  cream (KENALOG ) 0.1 % Apply 1 Application topically 2 (two) times daily. 10/31/24   Leatrice Vernell HERO, NP    Allergies: Patient has no known allergies.    Review of Systems  Updated Vital Signs BP (!) 139/100 (BP Location: Right Arm)   Pulse (!) 103   Temp 98.1 F (36.7 C)   Resp 16   Ht 5' 4 (1.626 m)   Wt 52.2 kg   LMP 10/12/2024 (Exact Date)   SpO2 100%   BMI 19.74 kg/m   Physical Exam Vitals and nursing note reviewed.  Constitutional:      Appearance: Normal appearance.  Eyes:     Comments: Left eye erythematous with scant purulent discharge. Conjunctival irritation with very mild swelling. Lids without redness or swelling. PERRL. FROM.   Neurological:     Mental Status: She is alert.     (all labs ordered are listed, but only abnormal results are displayed) Labs Reviewed - No data to display  EKG: None  Radiology: No results found.   Procedures   Medications Ordered in the ED  erythromycin  ophthalmic ointment 1 Application (has no administration in time range)    Clinical Course as of 11/02/24 2125  [facility] Nov 02, 2024  2119 Patient with left eye irritation this evening including purulent drainage. Findings c/w conjunctivitis. Erythromycin  ointment provided.  [SU]    Clinical Course  User Index [SU] Odell Balls, PA-C                                 Medical Decision Making Risk Prescription drug management.        Final diagnoses:  Acute bacterial conjunctivitis of left eye    ED Discharge Orders     None          Odell Balls RIGGERS 11/02/24 2125    Ula Prentice JONELLE, MD 11/02/24 2234
